# Patient Record
Sex: Female | Born: 1980
Health system: Southern US, Community
[De-identification: ages and names within clinical notes are randomized; demographics above are authoritative.]

## PROBLEM LIST (undated history)

## (undated) DIAGNOSIS — L509 Urticaria, unspecified: Secondary | ICD-10-CM

## (undated) DIAGNOSIS — H5500 Unspecified nystagmus: Secondary | ICD-10-CM

## (undated) DIAGNOSIS — G629 Polyneuropathy, unspecified: Secondary | ICD-10-CM

## (undated) DIAGNOSIS — D649 Anemia, unspecified: Secondary | ICD-10-CM

## (undated) DIAGNOSIS — F32A Depression, unspecified: Secondary | ICD-10-CM

## (undated) DIAGNOSIS — K219 Gastro-esophageal reflux disease without esophagitis: Secondary | ICD-10-CM

## (undated) DIAGNOSIS — F419 Anxiety disorder, unspecified: Secondary | ICD-10-CM

## (undated) DIAGNOSIS — R87619 Unspecified abnormal cytological findings in specimens from cervix uteri: Secondary | ICD-10-CM

## (undated) DIAGNOSIS — N946 Dysmenorrhea, unspecified: Secondary | ICD-10-CM

## (undated) DIAGNOSIS — M419 Scoliosis, unspecified: Secondary | ICD-10-CM

## (undated) DIAGNOSIS — F329 Major depressive disorder, single episode, unspecified: Secondary | ICD-10-CM

## (undated) DIAGNOSIS — H5501 Congenital nystagmus: Secondary | ICD-10-CM

## (undated) DIAGNOSIS — C801 Malignant (primary) neoplasm, unspecified: Secondary | ICD-10-CM

## (undated) DIAGNOSIS — L309 Dermatitis, unspecified: Secondary | ICD-10-CM

## (undated) HISTORY — DX: Dermatitis, unspecified: L30.9

## (undated) HISTORY — DX: Scoliosis, unspecified: M41.9

## (undated) HISTORY — DX: Anxiety disorder, unspecified: F41.9

## (undated) HISTORY — DX: Unspecified abnormal cytological findings in specimens from cervix uteri: R87.619

## (undated) HISTORY — DX: Anemia, unspecified: D64.9

## (undated) HISTORY — DX: Polyneuropathy, unspecified: G62.9

## (undated) HISTORY — DX: Depression, unspecified: F32.A

## (undated) HISTORY — DX: Urticaria, unspecified: L50.9

## (undated) HISTORY — DX: Gastro-esophageal reflux disease without esophagitis: K21.9

## (undated) HISTORY — DX: Congenital nystagmus: H55.01

## (undated) HISTORY — DX: Dysmenorrhea, unspecified: N94.6

## (undated) HISTORY — DX: Major depressive disorder, single episode, unspecified: F32.9

## (undated) HISTORY — PX: WISDOM TOOTH EXTRACTION: SHX21

## (undated) HISTORY — DX: Unspecified nystagmus: H55.00

---

## 2008-01-20 LAB — HM PAP SMEAR

## 2013-12-18 ENCOUNTER — Ambulatory Visit (INDEPENDENT_AMBULATORY_CARE_PROVIDER_SITE_OTHER): Payer: BC Managed Care – PPO | Admitting: Physician Assistant

## 2013-12-18 ENCOUNTER — Encounter: Payer: Self-pay | Admitting: Physician Assistant

## 2013-12-18 VITALS — BP 100/70 | HR 98 | Temp 98.2°F | Resp 16 | Ht 67.0 in | Wt 191.0 lb

## 2013-12-18 DIAGNOSIS — J029 Acute pharyngitis, unspecified: Secondary | ICD-10-CM

## 2013-12-18 DIAGNOSIS — J019 Acute sinusitis, unspecified: Secondary | ICD-10-CM

## 2013-12-18 LAB — POCT RAPID STREP A (OFFICE): Rapid Strep A Screen: NEGATIVE

## 2013-12-18 MED ORDER — CEPHALEXIN 500 MG PO CAPS: 500.0000 mg | ORAL_CAPSULE | Freq: Two times a day (BID) | ORAL | Status: DC

## 2013-12-18 MED ORDER — DOXYCYCLINE HYCLATE 100 MG PO TABS: 100.0000 mg | ORAL_TABLET | Freq: Two times a day (BID) | ORAL | Status: DC

## 2013-12-18 NOTE — Progress Notes (Signed)
Subjective:    Patient ID: Tara Velazquez, female    DOB: 11-Jul-1981, 33 y.o.   MRN: [id number]  Sore Throat  This is a new problem. The current episode started 1 to 4 weeks ago. The problem has been gradually worsening. Neither side of throat is experiencing more pain than the other. There has been no fever. The pain is mild. Associated symptoms include congestion, coughing, headaches, a hoarse voice and swollen glands. Pertinent negatives include no abdominal pain, diarrhea, drooling, ear discharge, ear pain, plugged ear sensation, neck pain, shortness of breath, stridor, trouble swallowing or vomiting. She has had exposure to strep (son positive for strep last week.). She has had no exposure to mono. Treatments tried: dayquil, excedrin. The treatment provided no relief.      Review of Systems  HENT: Positive for congestion and hoarse voice. Negative for drooling, ear discharge, ear pain and trouble swallowing.   Respiratory: Positive for cough. Negative for shortness of breath and stridor.   Gastrointestinal: Negative for vomiting, abdominal pain and diarrhea.  Musculoskeletal: Negative for neck pain.  Neurological: Positive for headaches.  All other systems reviewed and are negative.  Past Medical History  Diagnosis Date  . Depression    Past Surgical History  Procedure Laterality Date  . Cesarean section      2009, 2006, and 2004    reports that she has been smoking.  She does not have any smokeless tobacco history on file. She reports that she drinks alcohol. Her drug history is not on file. family history includes Colon cancer in an other family member; Diabetes in an other family member. Allergies  Allergen Reactions  . Amoxicillin   . Coconut Flavor        Objective:   Physical Exam  Nursing note and vitals reviewed. Constitutional: She is oriented to person, place, and time. She appears well-developed and well-nourished. No distress.  HENT:  Head: Normocephalic and  atraumatic.  Right Ear: External ear normal.  Left Ear: External ear normal.  Nose: Nose normal.  Mouth/Throat: No oropharyngeal exudate.  Oropharynx appeared slightly erythematous, no exudate.  Bilateral tympanic membranes appear normal.  Left maxillary sinus exquisitely tender to palpation. Right maxillary sinus nontender to palpation. Bilateral frontal sinuses nontender to palpation.  Eyes: Conjunctivae and EOM are normal. Pupils are equal, round, and reactive to light.  Neck: Normal range of motion. Neck supple. No tracheal deviation present. No thyromegaly present.  Cardiovascular: Normal rate, regular rhythm, normal heart sounds and intact distal pulses.  Exam reveals no gallop and no friction rub.   No murmur heard. Pulmonary/Chest: Effort normal and breath sounds normal. No respiratory distress. She has no wheezes. She has no rales. She exhibits no tenderness.  Abdominal: Soft. Bowel sounds are normal. There is no tenderness.  Musculoskeletal: Normal range of motion. She exhibits no edema.  Lymphadenopathy:    She has no cervical adenopathy.  Neurological: She is alert and oriented to person, place, and time. She has normal reflexes. No cranial nerve deficit. Coordination normal.  Skin: Skin is warm and dry. No rash noted. She is not diaphoretic. No erythema. No pallor.  Psychiatric: She has a normal mood and affect. Her behavior is normal. Judgment and thought content normal.   Filed Vitals:   12/18/13 0803  BP: 100/70  Pulse: 98  Temp: 98.2 F (36.8 C)  Resp: 16   No results found for this basename: WBC, HGB, HCT, PLT, GLUCOSE, CHOL, TRIG, HDL, LDLDIRECT, LDLCALC, ALT, AST, NA,  K, CL, CREATININE, BUN, CO2, TSH, PSA, INR, GLUF, HGBA1C, MICROALBUR      Assessment & Plan:  Merril was seen today for sore throat.  Diagnoses and associated orders for this visit:  Acute sinusitis - Discontinue: doxycycline (VIBRA-TABS) 100 MG tablet; Take 1 tablet (100 mg total) by mouth 2  (two) times daily. - cephALEXin (KEFLEX) 500 MG capsule; Take 1 capsule (500 mg total) by mouth 2 (two) times daily.  Sore throat - POC Rapid Strep A - Throat culture ([facility]) - cephALEXin (KEFLEX) 500 MG capsule; Take 1 capsule (500 mg total) by mouth 2 (two) times daily.    Patient Instructions  Cephalexin 1 tab by mouth twice a day x10 days for sinusitis.  We will call with the results of throat culture.  Followup to clinic if symptoms worsen or do not improve despite treatment.

## 2013-12-18 NOTE — Progress Notes (Signed)
Pre visit review using our clinic review tool, if applicable. No additional management support is needed unless otherwise documented below in the visit note. 

## 2013-12-18 NOTE — Patient Instructions (Addendum)
Cephalexin 1 tab by mouth twice a day x10 days for sinusitis.  We will call with the results of throat culture.  Followup to clinic if symptoms worsen or do not improve despite treatment.  Sinusitis Sinusitis is redness, soreness, and puffiness (inflammation) of the air pockets in the bones of your face (sinuses). The redness, soreness, and puffiness can cause air and mucus to get trapped in your sinuses. This can allow germs to grow and cause an infection.  HOME CARE   Drink enough fluids to keep your pee (urine) clear or pale yellow.  Use a humidifier in your home.  Run a hot shower to create steam in the bathroom. Sit in the bathroom with the door closed. Breathe in the steam 3 4 times a day.  Put a warm, moist washcloth on your face 3 4 times a day, or as told by your doctor.  Use salt water sprays (saline sprays) to wet the thick fluid in your nose. This can help the sinuses drain.  Only take medicine as told by your doctor. GET HELP RIGHT AWAY IF:   Your pain gets worse.  You have very bad headaches.  You are sick to your stomach (nauseous).  You throw up (vomit).  You are very sleepy (drowsy) all the time.  Your face is puffy (swollen).  Your vision changes.  You have a stiff neck.  You have trouble breathing. MAKE SURE YOU:   Understand these instructions.  Will watch your condition.  Will get help right away if you are not doing well or get worse. Document Released: 01/24/2008 Document Revised: 05/01/2012 Document Reviewed: 03/12/2012 [facility] Patient Information 2014 [employer].

## 2013-12-20 LAB — CULTURE, GROUP A STREP: Organism ID, Bacteria: NORMAL

## 2014-01-21 ENCOUNTER — Encounter: Payer: Self-pay | Admitting: Family Medicine

## 2014-01-21 ENCOUNTER — Ambulatory Visit (INDEPENDENT_AMBULATORY_CARE_PROVIDER_SITE_OTHER): Payer: BC Managed Care – PPO | Admitting: Family Medicine

## 2014-01-21 VITALS — BP 100/72 | HR 88 | Temp 98.1°F | Ht 67.0 in | Wt 189.5 lb

## 2014-01-21 DIAGNOSIS — J329 Chronic sinusitis, unspecified: Secondary | ICD-10-CM

## 2014-01-21 MED ORDER — DOXYCYCLINE HYCLATE 100 MG PO TABS: 100.0000 mg | ORAL_TABLET | Freq: Two times a day (BID) | ORAL | Status: DC

## 2014-01-21 NOTE — Patient Instructions (Addendum)
-  As we discussed, we have prescribed a new medication (DOXYCYCLINE) for you at this appointment. We discussed the common and serious potential adverse effects of this medication and you can review these and more with the pharmacist when you pick up your medication.  Please follow the instructions for use carefully and notify us immediately if you have any problems taking this medication.  -if sinus issues recur or persist call the Ear, Nose and throat doctor for an appointment

## 2014-01-21 NOTE — Progress Notes (Signed)
Pre visit review using our clinic review tool, if applicable. No additional management support is needed unless otherwise documented below in the visit note. 

## 2014-01-21 NOTE — Progress Notes (Signed)
No chief complaint on file.   HPI:  -started: 1 month ago and she reports treated for sinusitis and improved but then returned after antibiotic finished -symptoms:nasal congestion, sore throat, cough - sinus pressure and pain for 1 week -denies:fever, SOB, NVD, tooth pain -has tried: OTC cough and cold medications -sick contacts/travel/risks: denies flu exposure, tick exposure or or Ebola risks -son with similar symptoms -she reports she does't know if she even has allergy to amoxicillin - ? Rash with it once  ROS: See pertinent positives and negatives per HPI.  Past Medical History  Diagnosis Date  . Depression     Past Surgical History  Procedure Laterality Date  . Cesarean section      2009, 2006, and 2004    Family History  Problem Relation Age of Onset  . Colon cancer    . Diabetes      History   Social History  . Marital Status: Single    Spouse Name: N/A    Number of Children: N/A  . Years of Education: N/A   Social History Main Topics  . Smoking status: Light Tobacco Smoker  . Smokeless tobacco: Not on file  . Alcohol Use: Yes     Comment: once every couple of weeks   . Drug Use: Not on file  . Sexual Activity: Not on file   Other Topics Concern  . Not on file   Social History Narrative  . No narrative on file    Current outpatient prescriptions:cephALEXin (KEFLEX) 500 MG capsule, Take 1 capsule (500 mg total) by mouth 2 (two) times daily., Disp: 20 capsule, Rfl: 0;  IRON PO, Take by mouth., Disp: , Rfl:   EXAM:  There were no vitals filed for this visit.  There is no weight on file to calculate BMI.  GENERAL: vitals reviewed and listed above, alert, oriented, appears well hydrated and in no acute distress  HEENT: atraumatic, conjunttiva clear, no obvious abnormalities on inspection of external nose and ears, normal appearance of ear canals and TMs, clear nasal congestion, mild post oropharyngeal erythema with PND, no tonsillar edema or  exudate, no sinus TTP  NECK: no obvious masses on inspection  LUNGS: clear to auscultation bilaterally, no wheezes, rales or rhonchi, good air movement  CV: HRRR, no peripheral edema  MS: moves all extremities without noticeable abnormality  PSYCH: pleasant and cooperative, no obvious depression or anxiety  ASSESSMENT AND PLAN:  Discussed the following assessment and plan:  No diagnosis found.  -given HPI and exam findings today, a serious infection or illness is unlikely. We discussed potential etiologies, with VURI being most likely, and advised supportive care and monitoring. We discussed treatment side effects, likely course, antibiotic misuse, transmission, and signs of developing a serious illness. -of course, we advised to return or notify a doctor immediately if symptoms worsen or persist or new concerns arise.    There are no Patient Instructions on file for this visit.   Lucretia Kern

## 2014-05-07 ENCOUNTER — Telehealth: Payer: Self-pay | Admitting: Physician Assistant

## 2014-05-07 NOTE — Telephone Encounter (Signed)
Sure. Schedule new pt/transfer visit with me.

## 2014-05-07 NOTE — Telephone Encounter (Signed)
Pt would like to know if she can become a pt. She was with [facility].

## 2014-05-13 NOTE — Telephone Encounter (Signed)
Has been schedule

## 2014-06-23 DIAGNOSIS — R7989 Other specified abnormal findings of blood chemistry: Secondary | ICD-10-CM | POA: Insufficient documentation

## 2014-06-23 DIAGNOSIS — K219 Gastro-esophageal reflux disease without esophagitis: Secondary | ICD-10-CM | POA: Insufficient documentation

## 2014-06-23 DIAGNOSIS — N6019 Diffuse cystic mastopathy of unspecified breast: Secondary | ICD-10-CM | POA: Insufficient documentation

## 2014-06-23 DIAGNOSIS — R945 Abnormal results of liver function studies: Secondary | ICD-10-CM

## 2014-06-23 HISTORY — DX: Gastro-esophageal reflux disease without esophagitis: K21.9

## 2014-06-25 ENCOUNTER — Ambulatory Visit (INDEPENDENT_AMBULATORY_CARE_PROVIDER_SITE_OTHER): Payer: BC Managed Care – PPO | Admitting: Family Medicine

## 2014-06-25 ENCOUNTER — Encounter: Payer: Self-pay | Admitting: Family Medicine

## 2014-06-25 VITALS — BP 110/82 | HR 80 | Temp 97.9°F | Ht 67.0 in | Wt 186.8 lb

## 2014-06-25 DIAGNOSIS — Z23 Encounter for immunization: Secondary | ICD-10-CM

## 2014-06-25 DIAGNOSIS — Z720 Tobacco use: Secondary | ICD-10-CM

## 2014-06-25 DIAGNOSIS — R202 Paresthesia of skin: Secondary | ICD-10-CM

## 2014-06-25 DIAGNOSIS — Z7689 Persons encountering health services in other specified circumstances: Secondary | ICD-10-CM

## 2014-06-25 DIAGNOSIS — N920 Excessive and frequent menstruation with regular cycle: Secondary | ICD-10-CM

## 2014-06-25 DIAGNOSIS — R42 Dizziness and giddiness: Secondary | ICD-10-CM

## 2014-06-25 DIAGNOSIS — D509 Iron deficiency anemia, unspecified: Secondary | ICD-10-CM

## 2014-06-25 DIAGNOSIS — Z7189 Other specified counseling: Secondary | ICD-10-CM

## 2014-06-25 LAB — CBC WITH DIFFERENTIAL/PLATELET
BASOS ABS: 0 10*3/uL (ref 0.0–0.1)
Basophils Relative: 0.5 % (ref 0.0–3.0)
EOS PCT: 1.4 % (ref 0.0–5.0)
Eosinophils Absolute: 0.1 10*3/uL (ref 0.0–0.7)
HEMATOCRIT: 42 % (ref 36.0–46.0)
HEMOGLOBIN: 14 g/dL (ref 12.0–15.0)
LYMPHS ABS: 1.8 10*3/uL (ref 0.7–4.0)
LYMPHS PCT: 27.6 % (ref 12.0–46.0)
MCHC: 33.3 g/dL (ref 30.0–36.0)
MCV: 86.7 fl (ref 78.0–100.0)
Monocytes Absolute: 0.4 10*3/uL (ref 0.1–1.0)
Monocytes Relative: 6.5 % (ref 3.0–12.0)
Neutro Abs: 4.1 10*3/uL (ref 1.4–7.7)
Neutrophils Relative %: 64 % (ref 43.0–77.0)
Platelets: 236 10*3/uL (ref 150.0–400.0)
RBC: 4.84 Mil/uL (ref 3.87–5.11)
RDW: 13.4 % (ref 11.5–15.5)
WBC: 6.4 10*3/uL (ref 4.0–10.5)

## 2014-06-25 LAB — COMPREHENSIVE METABOLIC PANEL
ALT: 11 U/L (ref 0–35)
AST: 15 U/L (ref 0–37)
Albumin: 3.9 g/dL (ref 3.5–5.2)
Alkaline Phosphatase: 47 U/L (ref 39–117)
BILIRUBIN TOTAL: 1 mg/dL (ref 0.2–1.2)
BUN: 13 mg/dL (ref 6–23)
CO2: 22 mEq/L (ref 19–32)
CREATININE: 0.8 mg/dL (ref 0.4–1.2)
Calcium: 9.2 mg/dL (ref 8.4–10.5)
Chloride: 108 mEq/L (ref 96–112)
GFR: 84.75 mL/min (ref >60.00)
GLUCOSE: 80 mg/dL (ref 70–99)
Potassium: 4.5 mEq/L (ref 3.5–5.1)
SODIUM: 139 meq/L (ref 135–145)
Total Protein: 7.6 g/dL (ref 6.0–8.3)

## 2014-06-25 LAB — LIPID PANEL
CHOLESTEROL: 166 mg/dL (ref 0–200)
HDL: 43.1 mg/dL (ref >39.00)
LDL Cholesterol: 112 mg/dL — ABNORMAL HIGH (ref 0–99)
NONHDL: 122.9
Total CHOL/HDL Ratio: 4
Triglycerides: 57 mg/dL (ref 0.0–149.0)
VLDL: 11.4 mg/dL (ref 0.0–40.0)

## 2014-06-25 LAB — VITAMIN B12: Vitamin B-12: 563 pg/mL (ref 211–911)

## 2014-06-25 LAB — HEMOGLOBIN A1C: Hgb A1c MFr Bld: 4.8 % (ref 4.6–6.5)

## 2014-06-25 LAB — TSH: TSH: 1.2 u[IU]/mL (ref 0.35–4.50)

## 2014-06-25 MED ORDER — BUPROPION HCL ER (SR) 150 MG PO TB12: 150.0000 mg | ORAL_TABLET | Freq: Every day | ORAL | Status: DC

## 2014-06-25 NOTE — Addendum Note (Signed)
Addended by: Agnes Lawrence on: 06/25/2014 12:38 PM   Modules accepted: Orders

## 2014-06-25 NOTE — Progress Notes (Signed)
HPI:  Tara Velazquez is here to establish care.  Last PCP and physical:  Has the following chronic problems that require follow up and concerns today:  Anemia secondary to Heavy Menstrual Bleeding: -periods have been very heavy since 2009, heavy her whole life -regular, monthly, last 7 days, 4 days of heavy bleeding with super tampons/overnight pad at least hourly - can't leave the home -dx anemia 2004, Hgb 7.4 2013, sent to hospital, had work up, told should have hysterectomy, never saw gyn as advised, taking iron therapy -she wants to see a gynecologist now  Hx of major depression: -has been on several medications in the past including effexor, wellbutrin -has been several years since she has been on medication -denies: SI, thoughts of self harm  Tobacco Abuse: -1 ppd for 10 years -denies: CP, SOB, wheezing, hemoptysis, chronic cough  Vertigo/paresthesias in hands and feet intermittently: -for at least one year -several times per week, lasts for a few minutes -denies: weakness, vision changes, hearing loss, gait issues  ROS negative for unless reported above: fevers, unintentional weight loss, hearing or vision loss, chest pain, palpitations, struggling to breath, hemoptysis, melena, hematochezia, hematuria, falls, loc, si, thoughts of self harm  Past Medical History  Diagnosis Date  . Depression   . Nystagmus     her whole life, sees [facility]    Past Surgical History  Procedure Laterality Date  . Cesarean section      2009, 2006, and 2004    Family History  Problem Relation Age of Onset  . Colon cancer Father   . Cancer Brother     had hysterectomy at age 4  . Diabetes Maternal Grandmother   . Diabetes Paternal Grandfather     History   Social History  . Marital Status: Single    Spouse Name: N/A    Number of Children: N/A  . Years of Education: N/A   Social History Main Topics  . Smoking status: Current Every Day Smoker -- 1.00 packs/day   Types: Cigarettes  . Smokeless tobacco: None  . Alcohol Use: Yes     Comment: once every couple of weeks   . Drug Use: No  . Sexual Activity: None   Other Topics Concern  . None   Social History Narrative   Work or School: homemaker      Home Situation: lives with husband and 3 children      Spiritual Beliefs: none      Lifestyle: walks/jogs 2 times per week/ diet is poor          Current outpatient prescriptions: buPROPion (WELLBUTRIN SR) 150 MG 12 hr tablet, Take 1 tablet (150 mg total) by mouth daily., Disp: 90 tablet, Rfl: 0;  IRON PO, Take by mouth., Disp: , Rfl:   EXAM:  Filed Vitals:   06/25/14 1101  BP: 110/82  Pulse: 80  Temp: 97.9 F (36.6 C)    Body mass index is 29.25 kg/(m^2).  GENERAL: vitals reviewed and listed above, alert, oriented, appears well hydrated and in no acute distress  HEENT: atraumatic, conjunttiva clear, PERRLA, nystagmus, no obvious abnormalities on inspection of external nose and ears  NECK: no obvious masses on inspection  LUNGS: clear to auscultation bilaterally, no wheezes, rales or rhonchi, good air movement  CV: HRRR, no peripheral edema  MS: moves all extremities without noticeable abnormality  PSYCH: pleasant and cooperative, no obvious depression or anxiety  NEURO: CNII-XII grossly intact, gait normal, strength and sensation to light  touch and vibratory intact throughout, finger to nose normal, nystagmus  ASSESSMENT AND PLAN:  Discussed the following assessment and plan:  Menorrhagia with regular cycle - Plan: Ambulatory referral to Gynecology, CBC with Differential, CMP  Anemia, iron deficiency - Plan: Ambulatory referral to Gynecology, TSH  Encounter to establish care - Plan: Hemoglobin A1c, Lipid panel, CMP  Paresthesia - Plan: Vitamin B12 Vertigo - Plan: CMP, TSH -normal neuro exam, suspect anxiety, consider neuro referrla  Tobacco abuse - Plan: buPROPion (WELLBUTRIN SR) 150 MG 12 hr tablet -advised to  quit, counseled 5 minutes, opted to try wellbutrin again, follow up 6 weeks  -We reviewed the PMH, PSH, FH, SH, Meds and Allergies. -We provided refills for any medications we will prescribe as needed. -We addressed current concerns per orders and patient instructions. -We have asked for records for pertinent exams, studies, vaccines and notes from previous providers. -We have advised patient to follow up per instructions below.   -Patient advised to return or notify a doctor immediately if symptoms worsen or persist or new concerns arise.  Patient Instructions  BEFORE YOU LEAVE: -Tdap today -labs -follow up in 6 weeks  -We placed a referral for you as discussed to the gynecologist. It usually takes about 1-2 weeks to process and schedule this referral. If you have not heard from [place] regarding this appointment in 2 weeks please contact our office.  SMOKING: -start the wellbutrin 150 once per day in the morning tomorrow -quit smoking in 1 week -call the quitline -hang in there - you can do this!       Colin Benton R.

## 2014-06-25 NOTE — Progress Notes (Signed)
Pre visit review using our clinic review tool, if applicable. No additional management support is needed unless otherwise documented below in the visit note. 

## 2014-06-25 NOTE — Patient Instructions (Addendum)
BEFORE YOU LEAVE: -Tdap today -labs -follow up in 6 weeks  -We placed a referral for you as discussed to the gynecologist. It usually takes about 1-2 weeks to process and schedule this referral. If you have not heard from [place] regarding this appointment in 2 weeks please contact our office.  SMOKING: -start the wellbutrin 150 once per day in the morning tomorrow -quit smoking in 1 week -call the quitline -hang in there - you can do this!

## 2014-06-26 ENCOUNTER — Telehealth: Payer: Self-pay | Admitting: Family Medicine

## 2014-06-26 NOTE — Telephone Encounter (Signed)
emmi mailed  °

## 2014-07-31 ENCOUNTER — Telehealth: Payer: Self-pay

## 2014-07-31 ENCOUNTER — Encounter: Payer: Self-pay | Admitting: Obstetrics and Gynecology

## 2014-07-31 ENCOUNTER — Ambulatory Visit (INDEPENDENT_AMBULATORY_CARE_PROVIDER_SITE_OTHER): Payer: BC Managed Care – PPO | Admitting: Obstetrics and Gynecology

## 2014-07-31 VITALS — BP 110/70 | HR 76 | Resp 20 | Ht 66.0 in | Wt 188.0 lb

## 2014-07-31 DIAGNOSIS — N920 Excessive and frequent menstruation with regular cycle: Secondary | ICD-10-CM

## 2014-07-31 LAB — HEMOGLOBIN, FINGERSTICK: HEMOGLOBIN, FINGERSTICK: 13.3 g/dL (ref 12.0–16.0)

## 2014-07-31 NOTE — Progress Notes (Signed)
Patient ID: Tara Velazquez, female   DOB: March 03, 1981, 33 y.o.   MRN: [id number] GYNECOLOGY VISIT  PCP:  Colin Benton, DO  Referring provider:  Colin Benton, DO  HPI: 33 y.o.   Married  Caucasian  female   [phone] with Patient's last menstrual period was 07/28/2014 (exact date).   here for heavy menstrual cycles with moderate pain.  Cycles last 7 days and has a history of anemia.  Has been taking iron daily for 3 years.    Always had heavy cycles.  Menses every month.  Change pap and tampon every hour.  Some ibuprofen as needed for cramping. Takes iron every day.   States she gets tearful and crys when she has doctor visits.  Had difficulty getting tetanus injection due to anxiety.   Smoker.   43, 45 and 63 year old children.   Hgb:    14.0 06-25-14 with PCP, Hgb 13.3. today    GYNECOLOGIC HISTORY: Patient's last menstrual period was 07/28/2014 (exact date). Sexually active:  yes Partner preference: female Contraception:  Tubal  Menopausal hormone therapy: n/a DES exposure:  unsure Blood transfusions: no   Sexually transmitted diseases: no GYN procedures and prior surgeries:  Tubal ligation, C-Section x3 Last mammogram: n/a                Last pap and high risk HPV testing:   2008 wnl History of abnormal pap smear:  no   OB History    Gravida Para Term Preterm AB TAB SAB Ectopic Multiple Living   3 3 3       3        LIFESTYLE: Exercise:   Walking and "playing with kids"          Tobacco:    no Alcohol:      1-2 drinks per week Drug use:    no  There are no active problems to display for this patient.   Past Medical History  Diagnosis Date  . Depression   . Nystagmus     her whole life, sees [facility]  . Anemia     d/t heavy menstrual cycles  . Anxiety   . Dysmenorrhea     Past Surgical History  Procedure Laterality Date  . Cesarean section      2009, 2006, and 2004  . Wisdom tooth extraction      Current Outpatient Prescriptions  Medication Sig  Dispense Refill  . buPROPion (WELLBUTRIN SR) 150 MG 12 hr tablet Take 1 tablet (150 mg total) by mouth daily. 90 tablet 0  . IRON PO Take by mouth.     No current facility-administered medications for this visit.     ALLERGIES: Amoxicillin and Coconut flavor  Family History  Problem Relation Age of Onset  . Colon cancer Father   . Cancer Brother     had hysterectomy at age 58  . Diabetes Maternal Grandmother   . Diabetes Paternal Grandfather   . Cancer Sister 81    Dec from MVA--but had hysterectomy age 52 ?CA (pt. unsure of type)  . Hyperlipidemia Mother   . Stroke Maternal Grandfather     History   Social History  . Marital Status: Married    Spouse Name: N/A    Number of Children: N/A  . Years of Education: N/A   Occupational History  . Not on file.   Social History Main Topics  . Smoking status: Current Every Day Smoker -- 1.00 packs/day    Types:  Cigarettes  . Smokeless tobacco: Not on file  . Alcohol Use: 1.2 oz/week    2 Not specified per week     Comment: once every couple of weeks   . Drug Use: No  . Sexual Activity:    Partners: Male    Birth Control/ Protection: Surgical     Comment: Tubal   Other Topics Concern  . Not on file   Social History Narrative   Work or School: homemaker      Home Situation: lives with husband and 3 children      Spiritual Beliefs: none      Lifestyle: walks/jogs 2 times per week/ diet is poor          ROS:  Pertinent items are noted in HPI.  PHYSICAL EXAMINATION:    BP 110/70 mmHg  Pulse 76  Resp 20  Ht 5\' 6"  (1.676 m)  Wt 188 lb (85.276 kg)  BMI 30.36 kg/m2  LMP 07/28/2014 (Exact Date)   Wt Readings from Last 3 Encounters:  07/31/14 188 lb (85.276 kg)  06/25/14 186 lb 12.8 oz (84.732 kg)  01/21/14 189 lb 8 oz (85.957 kg)     Ht Readings from Last 3 Encounters:  07/31/14 5\' 6"  (1.676 m)  06/25/14 5\' 7"  (1.702 m)  01/21/14 5\' 7"  (1.702 m)    General appearance: alert, tearful, answers questions  well.  Declines physical examination today.   ASSESSMENT  Menorrhagia. History of anemia.  Status post BTL. Status post C/S x 3.   PLAN  Return for pelvic ultrasound and physical exam.  May need EMB.  Started to discuss this and patient started crying and asking to stop talking about this possibility.  Written materials to patient about menorrhagia.  30 minutes face to face time of which over 50% was spent in counseling.   An After Visit Summary was printed and given to the patient.

## 2014-07-31 NOTE — Telephone Encounter (Signed)
Called patient on [phone] to give results of HGB 13.3, no answer and voicemail did not identify patient.  LMOVM for patient to call me.

## 2014-07-31 NOTE — Patient Instructions (Signed)

## 2014-07-31 NOTE — Telephone Encounter (Signed)
Spoke with patient and notified Hgb 13.3 which is normal.  Someone from our office will be calling her to schedule her pelvic ultrasound.

## 2014-08-06 ENCOUNTER — Ambulatory Visit: Payer: BC Managed Care – PPO | Admitting: Family Medicine

## 2014-08-20 ENCOUNTER — Encounter: Payer: Self-pay | Admitting: Family Medicine

## 2014-08-20 ENCOUNTER — Ambulatory Visit (INDEPENDENT_AMBULATORY_CARE_PROVIDER_SITE_OTHER): Payer: BC Managed Care – PPO | Admitting: Family Medicine

## 2014-08-20 ENCOUNTER — Ambulatory Visit (INDEPENDENT_AMBULATORY_CARE_PROVIDER_SITE_OTHER): Payer: BC Managed Care – PPO | Admitting: Obstetrics and Gynecology

## 2014-08-20 ENCOUNTER — Ambulatory Visit (INDEPENDENT_AMBULATORY_CARE_PROVIDER_SITE_OTHER): Payer: BC Managed Care – PPO

## 2014-08-20 ENCOUNTER — Encounter: Payer: Self-pay | Admitting: Obstetrics and Gynecology

## 2014-08-20 VITALS — BP 110/80 | HR 99 | Temp 98.3°F | Ht 66.0 in | Wt 191.5 lb

## 2014-08-20 VITALS — BP 104/70 | HR 60 | Ht 66.0 in | Wt 191.0 lb

## 2014-08-20 DIAGNOSIS — N946 Dysmenorrhea, unspecified: Secondary | ICD-10-CM

## 2014-08-20 DIAGNOSIS — F4323 Adjustment disorder with mixed anxiety and depressed mood: Secondary | ICD-10-CM

## 2014-08-20 DIAGNOSIS — F17200 Nicotine dependence, unspecified, uncomplicated: Secondary | ICD-10-CM

## 2014-08-20 DIAGNOSIS — N920 Excessive and frequent menstruation with regular cycle: Secondary | ICD-10-CM

## 2014-08-20 DIAGNOSIS — F329 Major depressive disorder, single episode, unspecified: Secondary | ICD-10-CM

## 2014-08-20 DIAGNOSIS — Z720 Tobacco use: Secondary | ICD-10-CM

## 2014-08-20 DIAGNOSIS — F32A Depression, unspecified: Secondary | ICD-10-CM

## 2014-08-20 MED ORDER — IBUPROFEN 600 MG PO TABS: 600.0000 mg | ORAL_TABLET | Freq: Four times a day (QID) | ORAL | Status: DC | PRN

## 2014-08-20 MED ORDER — BUPROPION HCL ER (SR) 150 MG PO TB12: 150.0000 mg | ORAL_TABLET | Freq: Two times a day (BID) | ORAL | Status: DC

## 2014-08-20 NOTE — Progress Notes (Signed)
Pre visit review using our clinic review tool, if applicable. No additional management support is needed unless otherwise documented below in the visit note. 

## 2014-08-20 NOTE — Progress Notes (Signed)
  Subjective   Patient is here today for pelvic ultrasound for heavy regular cycles with moderate pain. Took ibuprofen 600 mg every 6 hours with last cycle and pain and bleeding stopped.  "I like to keep it simple." "I don't want surgery. "  Status post BTL.   Wants to try stopping iron.  Will see PCP in one month for a recheck and can check Hgb then.   Sister had diagnosis of germ cell tumor at age 34.   Objective   Images and report reviewed with patient.   Uterus without masses.  EMX 19.7 mm Ovaries WNL - 22 mm collapse left CL cyst.  Small free fluid in the pelvis:  26 x 17 mm.       Assessment  Menorrhagia and dysmenorrhea improved with NSAIDs. History of anemia.  Status post BTL. Status post C/S x 3.   Plan  Ibuprofen 600 gm po q 6 hours prn dysmenorrhea and menorrhagia with cycles.  Cautioned about gastritis. OK to stop iron.  Recheck Hgb with PCP in one month.  Follow up for annual exam in 2 months.    15 minutes face to face time of which over 50% was spent in counseling.   After visit summary to patient.

## 2014-08-20 NOTE — Progress Notes (Signed)
HPI:  Follow up:  Tobacco abuse: -rx for wellbutrin 06/2014 -reports: has been trying -  -denies: hemoptysis  Anxiety/Mild depression: -sufferes from depressed mood, frequent crying, hx depression - on effexor in the past - did not tolerate -feel wellbutrin has helped with depression and wants to continue/increase -reports does not want to take other medications or see psych -cries frequently at doctor visits - reported very heavy menstrual visits last visit - labs all normal; referred to gyn -per gyn notes when seen and they advised [place], etc she cried an told them to stop talking -reports: seeing gyn today -denies: SI, depression -has been long time since she has done counseling or seen psych  ROS: See pertinent positives and negatives per HPI.  Past Medical History  Diagnosis Date  . Depression   . Nystagmus     her whole life, sees [place]  . Anemia     d/t heavy menstrual cycles  . Anxiety   . Dysmenorrhea     Past Surgical History  Procedure Laterality Date  . Cesarean section      2009, 2006, and 2004  . Wisdom tooth extraction      Family History  Problem Relation Age of Onset  . Colon cancer Father   . Cancer Brother     had hysterectomy at age 68  . Diabetes Maternal Grandmother   . Diabetes Paternal Grandfather   . Cancer Sister 109    Dec from MVA--but had hysterectomy age 22 ?CA (pt. unsure of type)  . Hyperlipidemia Mother   . Stroke Maternal Grandfather     History   Social History  . Marital Status: Married    Spouse Name: N/A    Number of Children: N/A  . Years of Education: N/A   Social History Main Topics  . Smoking status: Current Every Day Smoker -- 1.00 packs/day    Types: Cigarettes  . Smokeless tobacco: None  . Alcohol Use: 1.2 oz/week    2 Not specified per week     Comment: once every couple of weeks   . Drug Use: No  . Sexual Activity:    Partners: Male    Birth Control/ Protection: Surgical     Comment: Tubal    Other Topics Concern  . None   Social History Narrative   Work or School: homemaker      Home Situation: lives with husband and 3 children      Spiritual Beliefs: none      Lifestyle: walks/jogs 2 times per week/ diet is poor          Current outpatient prescriptions: buPROPion (WELLBUTRIN SR) 150 MG 12 hr tablet, Take 1 tablet (150 mg total) by mouth 2 (two) times daily., Disp: 60 tablet, Rfl: 3;  IRON PO, Take by mouth., Disp: , Rfl:   EXAM:  Filed Vitals:   08/20/14 0850  BP: 110/80  Pulse: 99  Temp: 98.3 F (36.8 C)    Body mass index is 30.92 kg/(m^2).  GENERAL: vitals reviewed and listed above, alert, oriented, appears well hydrated and in no acute distress  HEENT: atraumatic, conjunttiva clear, no obvious abnormalities on inspection of external nose and ears  NECK: no obvious masses on inspection  LUNGS: clear to auscultation bilaterally, no wheezes, rales or rhonchi, good air movement  CV: HRRR, no peripheral edema  MS: moves all extremities without noticeable abnormality  PSYCH: pleasant and cooperative, no obvious depression or anxiety, tearful at times  ASSESSMENT AND  PLAN:  Discussed the following assessment and plan:  Depression - Plan: buPROPion (WELLBUTRIN SR) 150 MG 12 hr tablet  Adjustment disorder with mixed anxiety and depressed mood - Plan: buPROPion (WELLBUTRIN SR) 150 MG 12 hr tablet  Smoking - Plan: buPROPion (WELLBUTRIN SR) 150 MG 12 hr tablet  -not a fan of meds, nervous about counseling but agreed to try and numbers provided to call -up wellbutrin dose for depression, but explain this does not really treat anxiety -follow up in 1 month - sooner if any worsen -Patient advised to return or notify a doctor immediately if symptoms worsen or persist or new concerns arise.  Patient Instructions  BEFORE YOU LEAVE: -schedule follow up in 1 month  Increase the wellbutrin to 150mg  twice daily  Schedule counseling      Tara Velazquez,  Tara Velazquez R.

## 2014-08-20 NOTE — Patient Instructions (Signed)
BEFORE YOU LEAVE: -schedule follow up in 1 month  Increase the wellbutrin to 150mg  twice daily  Schedule counseling

## 2014-09-25 ENCOUNTER — Ambulatory Visit: Payer: BC Managed Care – PPO | Admitting: Family Medicine

## 2014-09-30 ENCOUNTER — Ambulatory Visit (INDEPENDENT_AMBULATORY_CARE_PROVIDER_SITE_OTHER): Payer: BLUE CROSS/BLUE SHIELD | Admitting: Family Medicine

## 2014-09-30 ENCOUNTER — Other Ambulatory Visit: Payer: Self-pay | Admitting: *Deleted

## 2014-09-30 ENCOUNTER — Encounter: Payer: Self-pay | Admitting: Family Medicine

## 2014-09-30 VITALS — BP 112/70 | HR 88 | Temp 98.1°F | Ht 65.75 in | Wt 177.2 lb

## 2014-09-30 DIAGNOSIS — F329 Major depressive disorder, single episode, unspecified: Secondary | ICD-10-CM

## 2014-09-30 DIAGNOSIS — F32A Depression, unspecified: Secondary | ICD-10-CM

## 2014-09-30 DIAGNOSIS — F419 Anxiety disorder, unspecified: Principal | ICD-10-CM

## 2014-09-30 DIAGNOSIS — F418 Other specified anxiety disorders: Secondary | ICD-10-CM

## 2014-09-30 DIAGNOSIS — D509 Iron deficiency anemia, unspecified: Secondary | ICD-10-CM

## 2014-09-30 DIAGNOSIS — Z720 Tobacco use: Secondary | ICD-10-CM

## 2014-09-30 DIAGNOSIS — D500 Iron deficiency anemia secondary to blood loss (chronic): Secondary | ICD-10-CM

## 2014-09-30 DIAGNOSIS — F17200 Nicotine dependence, unspecified, uncomplicated: Secondary | ICD-10-CM

## 2014-09-30 LAB — CBC WITH DIFFERENTIAL/PLATELET
Basophils Absolute: 0 10*3/uL (ref 0.0–0.1)
Basophils Relative: 0.5 % (ref 0.0–3.0)
EOS ABS: 0.1 10*3/uL (ref 0.0–0.7)
Eosinophils Relative: 1.8 % (ref 0.0–5.0)
HCT: 35.8 % — ABNORMAL LOW (ref 36.0–46.0)
HEMOGLOBIN: 11.9 g/dL — AB (ref 12.0–15.0)
LYMPHS PCT: 21.7 % (ref 12.0–46.0)
Lymphs Abs: 1 10*3/uL (ref 0.7–4.0)
MCHC: 33.2 g/dL (ref 30.0–36.0)
MCV: 84.1 fl (ref 78.0–100.0)
Monocytes Absolute: 0.3 10*3/uL (ref 0.1–1.0)
Monocytes Relative: 6.3 % (ref 3.0–12.0)
Neutro Abs: 3.1 10*3/uL (ref 1.4–7.7)
Neutrophils Relative %: 69.7 % (ref 43.0–77.0)
Platelets: 204 10*3/uL (ref 150.0–400.0)
RBC: 4.26 Mil/uL (ref 3.87–5.11)
RDW: 14 % (ref 11.5–15.5)
WBC: 4.4 10*3/uL (ref 4.0–10.5)

## 2014-09-30 LAB — POCT HEMOGLOBIN: Hemoglobin: 10.9 g/dL — AB (ref 12.2–16.2)

## 2014-09-30 NOTE — Patient Instructions (Signed)
BEFORE YOU LEAVE: -lab -follow up in 3 months  Quit smoking  Continue wellbutrin

## 2014-09-30 NOTE — Progress Notes (Signed)
HPI:  Follow up anxiety and depression:  Depression and anxiety: -increased wellbutrin and advised cbt and 1 mo f/u 07/2014 -suffered from depressed mood, frequent crying, hx depression - on effexor in the past - did not tolerate -feels wellbutrin has helped with depression and has not had any crying spells -cries frequently at doctor visits Sleep disorder: Interest deficit/anhedonia: no, rare Guilt (worthlessness, hopelessness, regret): no Energy deficit: no Concentration deficit: mild Appetite disorder: no Psychomotor retardation or agitation: no Suicidality: no  Tobacco abuse: -rx for wellbutrin 06/2014 -reports: has been trying to quit - has cut back  -denies: hemoptysis  Iron def anemia secondary to Menorrhagia: -now on ibuprofen and bleeding much improved so stopped iron  - told to check hgb today by ob -she refuses CBC as wants finger prick - if off agrees to do cbc   ROS: See pertinent positives and negatives per HPI.  Past Medical History  Diagnosis Date  . Depression   . Nystagmus     her whole life, sees [facility]  . Anemia     d/t heavy menstrual cycles  . Anxiety   . Dysmenorrhea     Past Surgical History  Procedure Laterality Date  . Cesarean section      2009, 2006, and 2004  . Wisdom tooth extraction      Family History  Problem Relation Age of Onset  . Colon cancer Father   . Cancer Brother     had hysterectomy at age 90  . Diabetes Maternal Grandmother   . Diabetes Paternal Grandfather   . Cancer Sister 56    Dec from MVA--but had hysterectomy age 29 ?CA (pt. states mom told her -- sister dx'd with germ induced ovarian ca)  . Hyperlipidemia Mother   . Stroke Maternal Grandfather     History   Social History  . Marital Status: Married    Spouse Name: N/A  . Number of Children: N/A  . Years of Education: N/A   Social History Main Topics  . Smoking status: Current Every Day Smoker -- 1.00 packs/day    Types: Cigarettes  .  Smokeless tobacco: Not on file  . Alcohol Use: 1.2 oz/week    2 Standard drinks or equivalent per week     Comment: once every couple of weeks   . Drug Use: No  . Sexual Activity:    Partners: Male    Birth Control/ Protection: Surgical     Comment: Tubal   Other Topics Concern  . None   Social History Narrative   Work or School: homemaker      Home Situation: lives with husband and 3 children      Spiritual Beliefs: none      Lifestyle: walks/jogs 2 times per week/ diet is poor           Current outpatient prescriptions:  .  buPROPion (WELLBUTRIN SR) 150 MG 12 hr tablet, Take 1 tablet (150 mg total) by mouth 2 (two) times daily., Disp: 60 tablet, Rfl: 3 .  ibuprofen (ADVIL,MOTRIN) 600 MG tablet, Take 1 tablet (600 mg total) by mouth every 6 (six) hours as needed for cramping., Disp: 30 tablet, Rfl: 5  EXAM:  Filed Vitals:   09/30/14 1101  BP: 112/70  Pulse: 88  Temp: 98.1 F (36.7 C)    Body mass index is 28.82 kg/(m^2).  GENERAL: vitals reviewed and listed above, alert, oriented, appears well hydrated and in no acute distress  HEENT: atraumatic, conjunttiva clear,  no obvious abnormalities on inspection of external nose and ears  NECK: no obvious masses on inspection  LUNGS: clear to auscultation bilaterally, no wheezes, rales or rhonchi, good air movement  CV: HRRR, no peripheral edema  MS: moves all extremities without noticeable abnormality  PSYCH: pleasant and cooperative, no obvious depression or anxiety  ASSESSMENT AND PLAN:  Discussed the following assessment and plan:  Anxiety and depression -much improved, continue Wellbutrin -again advised CBT but she does not like to talk to people  Iron deficiency anemia due to chronic blood loss - Plan: POC Hemoglobin -cbc if low  Tobacco use disorder -counseled for 3-10 minutes -advised to quit -she will continue to try, offered help, 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  Patient Instructions  BEFORE YOU LEAVE: -lab -follow up in 3 months  Quit smoking  Continue wellbutrin     Colin Benton R.

## 2014-09-30 NOTE — Addendum Note (Signed)
Addended by: Joyce Gross R on: 09/30/2014 11:36 AM   Modules accepted: Orders

## 2014-09-30 NOTE — Progress Notes (Signed)
Pre visit review using our clinic review tool, if applicable. No additional management support is needed unless otherwise documented below in the visit note. 

## 2014-12-04 ENCOUNTER — Ambulatory Visit: Payer: BC Managed Care – PPO | Admitting: Obstetrics and Gynecology

## 2014-12-28 ENCOUNTER — Telehealth: Payer: Self-pay | Admitting: Nurse Practitioner

## 2014-12-28 NOTE — Telephone Encounter (Signed)
Left message regarding upcoming appointment has been canceled and needs to be rescheduled. °

## 2014-12-29 ENCOUNTER — Ambulatory Visit: Payer: Self-pay | Admitting: Nurse Practitioner

## 2015-01-05 ENCOUNTER — Other Ambulatory Visit: Payer: Self-pay | Admitting: Family Medicine

## 2015-02-09 ENCOUNTER — Other Ambulatory Visit: Payer: Self-pay | Admitting: Family Medicine

## 2015-03-08 ENCOUNTER — Encounter: Payer: Self-pay | Admitting: Nurse Practitioner

## 2015-03-08 ENCOUNTER — Ambulatory Visit (INDEPENDENT_AMBULATORY_CARE_PROVIDER_SITE_OTHER): Payer: BLUE CROSS/BLUE SHIELD | Admitting: Nurse Practitioner

## 2015-03-08 VITALS — BP 114/70 | HR 62 | Resp 16 | Ht 65.75 in | Wt 182.0 lb

## 2015-03-08 DIAGNOSIS — R87619 Unspecified abnormal cytological findings in specimens from cervix uteri: Secondary | ICD-10-CM | POA: Diagnosis not present

## 2015-03-08 DIAGNOSIS — Z01419 Encounter for gynecological examination (general) (routine) without abnormal findings: Secondary | ICD-10-CM | POA: Diagnosis not present

## 2015-03-08 DIAGNOSIS — Z0000 Encounter for general adult medical examination without abnormal findings: Secondary | ICD-10-CM

## 2015-03-08 HISTORY — DX: Unspecified abnormal cytological findings in specimens from cervix uteri: R87.619

## 2015-03-08 NOTE — Patient Instructions (Signed)

## 2015-03-08 NOTE — Progress Notes (Signed)
33 y.o. [id number] Married  Caucasian Fe here for annual exam.  Menses for 7 days. Heavy for 3 days.  Always using super overnight pad and changing every 2-3 hours.  Ibuprofen X 3 days.  Secondary to menorrhagia has anemia - Now back on oral iron per PCP.  She had PUS 08/20/14 with normal findings. She has been remarried for 7 years.  She had BTL done after last pregnancy and now considering IVF.  She is very much against any treatment for menorrhagia even if not considering another pregnancy.  Patient's last menstrual period was 02/09/2015 (exact date).       Sexually active: Yes.    The current method of family planning is BTL.   Exercising: Yes.    walking Smoker:  yes  Health Maintenance: Pap:  01/20/2008 per pt TDaP:  06/25/2014 Labs: PCP   reports that she has been smoking Cigarettes.  She has been smoking about 1.00 pack per day. She does not have any smokeless tobacco history on file. She reports that she drinks about 1.8 oz of alcohol per week. She reports that she does not use illicit drugs.  Past Medical History  Diagnosis Date  . Depression   . Nystagmus     her whole life, sees [place]  . Anemia     d/t heavy menstrual cycles  . Anxiety   . Dysmenorrhea     Past Surgical History  Procedure Laterality Date  . Cesarean section      2009, 2006, and 2004  . Wisdom tooth extraction      Current Outpatient Prescriptions  Medication Sig Dispense Refill  . buPROPion (WELLBUTRIN SR) 150 MG 12 hr tablet TAKE ONE TABLET BY MOUTH TWICE DAILY 60 tablet 0  . ibuprofen (ADVIL,MOTRIN) 600 MG tablet Take 1 tablet (600 mg total) by mouth every 6 (six) hours as needed for cramping. 30 tablet 5   No current facility-administered medications for this visit.    Family History  Problem Relation Age of Onset  . Colon cancer Father   . Cancer Brother     had hysterectomy at age 85  . Diabetes Maternal Grandmother   . Diabetes Paternal Grandfather   . Cancer Sister 49    Dec from  MVA--but had hysterectomy age 67 ?CA (pt. states mom told her -- sister dx'd with germ induced ovarian ca)  . Hyperlipidemia Mother   . Stroke Maternal Grandfather     ROS:  Pertinent items are noted in HPI.  Otherwise, a comprehensive ROS was negative.  Exam:   BP 114/70 mmHg  Pulse 62  Resp 16  Ht 5' 5.75" (1.67 m)  Wt 182 lb (82.555 kg)  BMI 29.60 kg/m2  LMP 02/09/2015 (Exact Date) Height: 5' 5.75" (167 cm) Ht Readings from Last 3 Encounters:  03/08/15 5' 5.75" (1.67 m)  09/30/14 5' 5.75" (1.67 m)  08/20/14 5\' 6"  (1.676 m)    General appearance: alert, cooperative and appears stated age Head: Normocephalic, without obvious abnormality, atraumatic, nystagmus bilaterally Neck: no adenopathy, supple, symmetrical, trachea midline and thyroid normal to inspection and palpation Lungs: clear to auscultation bilaterally Breasts: normal appearance, no masses or tenderness Heart: regular rate and rhythm Abdomen: soft, non-tender; no masses,  no organomegaly Extremities: extremities normal, atraumatic, no cyanosis or edema Skin: Skin color, texture, turgor normal. No rashes or lesions Lymph nodes: Cervical, supraclavicular, and axillary nodes normal. No abnormal inguinal nodes palpated Neurologic: Grossly normal   Pelvic: External genitalia:  no lesions  Urethra:  normal appearing urethra with no masses, tenderness or lesions              Bartholin's and Skene's: normal                 Vagina: normal appearing vagina with normal color and discharge, no lesions              Cervix: anteverted              Pap taken: Yes.   Bimanual Exam:  Uterus:  normal size, contour, position, consistency, mobility, non-tender              Adnexa: no mass, fullness, tenderness               Rectovaginal: Confirms               Anus:  normal sphincter tone, no lesions  Chaperone present: Yes  A:  Well Woman with normal exam  Menorrhagia - declines any treatment   History of  anemia.   Status post BTL.  Status post C/S x 3.    P:   Reviewed health and wellness pertinent to exam  Pap smear as above  Discussed various treatment options even though brief as she was not interested including OCP and Mirena IUD.  Counseled on breast self exam, adequate intake of calcium and vitamin D, diet and exercise return annually or prn  An After Visit Summary was printed and given to the patient.

## 2015-03-09 NOTE — Progress Notes (Signed)
Encounter reviewed by Dr. Brook Amundson C. Silva.  

## 2015-03-11 LAB — IPS PAP TEST WITH HPV

## 2015-03-17 LAB — IPS HPV GENOTYPING 16/18

## 2015-03-22 ENCOUNTER — Telehealth: Payer: Self-pay | Admitting: Emergency Medicine

## 2015-03-22 DIAGNOSIS — R87810 Cervical high risk human papillomavirus (HPV) DNA test positive: Secondary | ICD-10-CM

## 2015-03-22 NOTE — Telephone Encounter (Signed)
Call to patient. Left message to call back. Ask for Tara Velazquez.

## 2015-03-22 NOTE — Telephone Encounter (Signed)
-----   Message from Kem Boroughs, [place] sent at 03/17/2015  4:52 PM EDT ----- Please let Tara Velazquez know that pap was normal but HPV test was positive - we then did # 16 & 18 genotype and one of them was positive.  We should do a vaginal colpo to make sure everything is OK.

## 2015-03-22 NOTE — Telephone Encounter (Signed)
Patient is returning a call to Sally. °

## 2015-03-22 NOTE — Telephone Encounter (Signed)
Return call to patient. Advised pap smear result abnormal indicating the need for additional evaluation with colposcopy. Brief description of procedure. Patient with BTSP. Colpo scheduled for 03-31-15 at 1000 with Dr Quincy Simmonds. Instructed to take Motrin 800 mg one hour prior with food.  Annual  with [address] 03-08-15. Pap normal with positive HR HPV Positive 16/18.  Routing to provider for final review. Patient agreeable to disposition. Will close encounter.   CC: Dr Quincy Simmonds:  Juluis Rainier.

## 2015-03-24 ENCOUNTER — Other Ambulatory Visit: Payer: Self-pay | Admitting: Family Medicine

## 2015-03-30 ENCOUNTER — Telehealth: Payer: Self-pay | Admitting: Emergency Medicine

## 2015-03-30 NOTE — Telephone Encounter (Signed)
I will be happy to discuss with the patient her pap and HPV results.  If she and decide that it is better for her to reschedule the colposcopy for another day and receive Ativan prior to procedure and have a driver take her home, this is an option.   [place]

## 2015-03-30 NOTE — Telephone Encounter (Addendum)
Patient on the line to discuss coverage of procedure with Scientist, clinical (histocompatibility and immunogenetics). Patient received letter advising her there were no malignancies on her pap smear. Advised patient that the letter she received is correct, but that the letter states that additional testing may have been ordered and it was, this was testing for high risk HPV. Advised she has testing positive for HPV type 16 on her cervix and that additional diagnostic testing is indicated to evaluate for abnormal cells. Patient expresses frustration that she needs to have colposcopy after receiving a letter that tests were normal. We discussed HPV results and the need for colposcopy to evaluate cervix and to test to any abnormal cells that may not be able to be seen in screening testing pap smear. Patient states "You're not going to want to see me tomorrow. I am so angry, I am going to lose it in your office. I already can't control myself while I am there, and this is much worse." Offered to cancel procedure appointment and change to consult appointment to discuss with provider her concerns and address possible anxiety related to procedure and office visit and patient declines states "My husband is already off to watch the kids, there is nothing else you can do." Advised that Dr. Quincy Simmonds will speak with her prior to procedure and she can choose to proceed or not based on conversation if she would like to. Patient declines any offer by this RN to reschedule or change appointment.   Routing to Dr. Quincy Simmonds to review and Lamont Snowball, RN, nursing supervisor.

## 2015-03-30 NOTE — Telephone Encounter (Signed)
I called patient to review benefit. She understands and is agreeable to benefit and arrival time for procedure. Patient then had a question about the labs surrounding the procedure. Transferred to nurse Karen Chafe to discuss.

## 2015-03-30 NOTE — Telephone Encounter (Signed)
Call to patient to review concerns from previous call and options provided by Dr Quincy Simmonds. Patient states all of her questions have been answered. Advised that due to the her concerns over procedure, it is our attempt to make this easier for her. patient states she has no one that can drive her and she cannot reschedule to another time as still would not have anyone other than husband who has to watch her kids. She states that every time we call her we are just making it worse and she started to cry. States she has to go and disconnected call.   Appointment tomorrow morning at 10. Routing to provider. Encounter closed

## 2015-03-31 ENCOUNTER — Encounter: Payer: Self-pay | Admitting: Obstetrics and Gynecology

## 2015-03-31 ENCOUNTER — Ambulatory Visit (INDEPENDENT_AMBULATORY_CARE_PROVIDER_SITE_OTHER): Payer: BLUE CROSS/BLUE SHIELD | Admitting: Obstetrics and Gynecology

## 2015-03-31 DIAGNOSIS — R87810 Cervical high risk human papillomavirus (HPV) DNA test positive: Secondary | ICD-10-CM

## 2015-03-31 NOTE — Progress Notes (Signed)
Subjective:     Patient ID: Tara Velazquez, female   DOB: 1980-10-08, 34 y.o.   MRN: [id number]  HPI  Patient here for colposcopy today.  Pap smear on 03-08-15 revealing normal pap but HR HPV and Pos. #16 Genotype.  Patient unhappy about communication of her pap and HR HPV results. She states she was told her cells were abnormal and that her letter said they were normal.  She then found out that the HR HPV testing is what was abnormal.   Patient has extreme anxiety with doctor visits. She has declined antianxiety medication for procedure and rescheduling for another day when a driver can be with her here.  She states this is not an option today or another day. Just wants to get the colposcopy done.   Review of Systems  LMP:  03-09-15 Contraception:Tubal     Objective:   Physical Exam  Genitourinary:     Patient anxious and teary but appropriate.   Chaperone present for procedure.   Colposcopy.  Consent for procedure.  Speculum placed in vagina.  3% acetic acid placed in vagina.  Colposcopy satisfactory.  ECC performed and sent to pathology.  Biopsy of cervix at 6:00 send to pathology.  Minimal bleeding.  No Monsel's needed.  No complications.     Assessment:     Normal pap and positive HR HPV 16.     Plan:     Instructions and precautions given.  Follow up biopsy results.  I anticipate cotesting in 12 months.

## 2015-03-31 NOTE — Patient Instructions (Signed)
Colposcopy Care After Colposcopy is a procedure in which a special tool is used to magnify the surface of the cervix. A tissue sample (biopsy) may also be taken. This sample will be looked at for cervical cancer or other problems. After the test:  You may have some cramping.  Lie down for a few minutes if you feel lightheaded.   You may have some bleeding which should stop in a few days. HOME CARE  Do not have sex or use tampons for 2 to 3 days or as told.  Only take medicine as told by your doctor.  Continue to take your birth control pills as usual. Finding out the results of your test Ask when your test results will be ready. Make sure you get your test results. GET HELP RIGHT AWAY IF:  You are bleeding a lot or are passing blood clots.  You develop a fever of 102 F (38.9 C) or higher.  You have abnormal vaginal discharge.  You have cramps that do not go away with medicine.  You feel lightheaded, dizzy, or pass out (faint). MAKE SURE YOU:   Understand these instructions.  Will watch your condition.  Will get help right away if you are not doing well or get worse. Document Released: 01/24/2008 Document Revised: 10/30/2011 Document Reviewed: 03/06/2013 [facility] Patient Information 2015 [place], [place]. This information is not intended to replace advice given to you by your health care provider. Make sure you discuss any questions you have with your health care provider.

## 2015-04-01 ENCOUNTER — Encounter: Payer: Self-pay | Admitting: Obstetrics and Gynecology

## 2015-04-02 LAB — IPS OTHER TISSUE BIOPSY

## 2015-04-05 ENCOUNTER — Telehealth: Payer: Self-pay | Admitting: *Deleted

## 2015-04-05 NOTE — Telephone Encounter (Signed)
Patient returns call. Advised Dr Quincy Simmonds would like for her to come in to discuss results.  Patient very upset that about having to come back in instead of being told results "correctly" over the phone. Advised although not urgent, this is something Dr Quincy Simmonds would like to discuss personally with her. Patient then upset about copay and hour long wait. Offered early am appointment if that was better for patient, Friday at [place]. Patient states husband has appointment then. Patient becoming agitated and upset wants another nurse to speak when I was unable to accommodate this, patient disconnected call.

## 2015-04-05 NOTE — Telephone Encounter (Signed)
Call to patient, left message to call back. 

## 2015-04-05 NOTE — Telephone Encounter (Signed)
Returned call to patient. Advised needs to schedule appointment with Dr. Quincy Simmonds to discuss test results. Also confirmed with Dr. Quincy Simmonds that office visit is needed. Advised patient of this and encouraged her to bring someone with her to the appointment on 04/08/15.   Routing to provider for final review.

## 2015-04-05 NOTE — Telephone Encounter (Signed)
-----   Message from Nunzio Cobbs, MD sent at 04/04/2015  5:00 PM EDT ----- Please have patient make an appointment for a talking visit with me this week regarding her colposcopy results.  She will need further treatment for the abnormal cells detected on her colposcopy biopsy.   These were from the ECC, high inside the canal, so it was not possible to see them at the time of the colposcopy. I strongly recommend she have a support person present with her as she is very anxious with her medical visits.   Cc- Patty Gabriela Eves

## 2015-04-05 NOTE — Telephone Encounter (Signed)
Patient wants to speak with Dr. Quincy Simmonds to discuss results.

## 2015-04-08 ENCOUNTER — Ambulatory Visit (INDEPENDENT_AMBULATORY_CARE_PROVIDER_SITE_OTHER): Payer: BLUE CROSS/BLUE SHIELD | Admitting: Obstetrics and Gynecology

## 2015-04-08 ENCOUNTER — Encounter: Payer: Self-pay | Admitting: Obstetrics and Gynecology

## 2015-04-08 VITALS — BP 110/80 | HR 70 | Ht 65.75 in | Wt 182.0 lb

## 2015-04-08 DIAGNOSIS — IMO0002 Reserved for concepts with insufficient information to code with codable children: Secondary | ICD-10-CM

## 2015-04-08 DIAGNOSIS — R896 Abnormal cytological findings in specimens from other organs, systems and tissues: Secondary | ICD-10-CM

## 2015-04-08 DIAGNOSIS — D069 Carcinoma in situ of cervix, unspecified: Secondary | ICD-10-CM

## 2015-04-08 NOTE — Progress Notes (Signed)
Patient ID: Tara Velazquez, female   DOB: [birth year], 34 y.o.   MRN: [id number] GYNECOLOGY  VISIT   HPI: 34 y.o.   Married  Caucasian  female   [phone] with Patient's last menstrual period was 03/09/2015 (exact date).   here to discuss pathology results from colposcopy and treatment options.   Very nervous and worried about potential cancer.   Will see Dr. [facility] in the reproductive endocrinology department next week about potential IVF for pregnancy.  Has had tubal ligation.   GYNECOLOGIC HISTORY: Patient's last menstrual period was 03/09/2015 (exact date). Contraception:Tubal Menopausal hormone therapy: n/a Last mammogram: n/a Last pap smear: 03-08-15 Neg:Pos HR HPV;#16 pos HPV genotype  Colposcopy 03-31-15 ECC at least High-Grade dysplasia,cannot exclude Adenocarcinoma in situ.        OB History    Gravida Para Term Preterm AB TAB SAB Ectopic Multiple Living   3 3 3       3          There are no active problems to display for this patient.   Past Medical History  Diagnosis Date  . Depression   . Nystagmus     her whole life, sees [place]  . Anemia     d/t heavy menstrual cycles  . Anxiety   . Dysmenorrhea     Past Surgical History  Procedure Laterality Date  . Cesarean section      2009, 2006, and 2004  . Wisdom tooth extraction      Current Outpatient Prescriptions  Medication Sig Dispense Refill  . buPROPion (WELLBUTRIN SR) 150 MG 12 hr tablet TAKE ONE TABLET BY MOUTH TWICE DAILY 60 tablet 0  . ferrous sulfate 325 (65 FE) MG tablet Take 325 mg by mouth daily with breakfast.    . ibuprofen (ADVIL,MOTRIN) 600 MG tablet Take 1 tablet (600 mg total) by mouth every 6 (six) hours as needed for cramping. 30 tablet 5   No current facility-administered medications for this visit.     ALLERGIES: Amoxicillin and Coconut flavor  Family History  Problem Relation Age of Onset  . Colon cancer Father   . Cancer Brother     had hysterectomy at age 94  .  Diabetes Maternal Grandmother   . Diabetes Paternal Grandfather   . Cancer Sister 91    Dec from MVA--but had hysterectomy age 78 ?[place] (pt. states mom told her -- sister dx'd with germ induced ovarian ca)  . Hyperlipidemia Mother   . Stroke Maternal Grandfather     Social History   Social History  . Marital Status: Married    Spouse Name: N/A  . Number of Children: N/A  . Years of Education: N/A   Occupational History  . Not on file.   Social History Main Topics  . Smoking status: Current Every Day Smoker -- 1.00 packs/day    Types: Cigarettes  . Smokeless tobacco: Not on file  . Alcohol Use: 1.8 oz/week    3 Standard drinks or equivalent per week     Comment: once every couple of weeks   . Drug Use: No  . Sexual Activity:    Partners: Male    Birth Control/ Protection: Surgical     Comment: Tubal   Other Topics Concern  . Not on file   Social History Narrative   Work or School: homemaker      Home Situation: lives with husband and 3 children      Spiritual Beliefs: none  Lifestyle: walks/jogs 2 times per week/ diet is poor          ROS:  Pertinent items are noted in HPI.  PHYSICAL EXAMINATION:    BP 110/80 mmHg  Pulse 70  Ht 5' 5.75" (1.67 m)  Wt 182 lb (82.555 kg)  BMI 29.60 kg/m2  LMP 03/09/2015 (Exact Date)    General appearance: alert, cooperative and appears stated age  ASSESSMENT  Normal pap and positive HR HPV. Colposcopy 03-31-15 ECC at least High-Grade squamous dysplasia, cannot exclude Adenocarcinoma in situ.  PLAN  Counseled regarding cervical dysplasia - high grade squamous and adenocarcinoma in situ using UpToDate summary for patient.  Discussed options for care including cold knife conization with ECC versus hysterectomy.  Discussed skip lesions of adenocarcinoma in situ and rate of recurrence/progression for adenocarcinoma in situ and adenocarcinoma. Discussed cold knife conization effects on future pregnancy - fibrosis of the  cervix, potential for increased preterm labor and delivery, and increased risk of second trimester loss. Patient will proceed with consultation for potential IVF and office will contact patient back for the end of next week for planning for care. Patient declines GYN ONC consultation.   An After Visit Summary was printed and given to the patient.  _25_____ minutes face to face time of which over 50% was spent in counseling.

## 2015-04-09 ENCOUNTER — Ambulatory Visit: Payer: BLUE CROSS/BLUE SHIELD | Admitting: Obstetrics and Gynecology

## 2015-04-12 ENCOUNTER — Telehealth: Payer: Self-pay | Admitting: Obstetrics and Gynecology

## 2015-04-12 NOTE — Telephone Encounter (Signed)
Ok to proceed with scheduling of cold knife conization of the cervix with ECC for adenocarcinoma of the cervix.  Gay Filler will need to be involved in the surgical scheduling and planning.   I will send precert request in a staff message to our ins. Dept.  Cc- Lamont Snowball

## 2015-04-12 NOTE — Telephone Encounter (Signed)
Thank you for the update!

## 2015-04-12 NOTE — Telephone Encounter (Signed)
Patient says she is calling to schedule a procedure. 

## 2015-04-12 NOTE — Telephone Encounter (Addendum)
Spoke with patient. Patient confirms she is ready to schedule surgery. Advised of office procedure in scheduling of surgeries/procedures. Advised Lamont Snowball (surgery scheduler/Nurse Manager) will contact her as well as she will be contacted by our insurance department with insurance pre-certification information. Patient agreeable to above.   Routing to Dr. Quincy Simmonds JD:[id number] and Mabeline Caras Fast, RN present for phone call.

## 2015-04-12 NOTE — Telephone Encounter (Signed)
Spoke with patient. Patient states that she would like to schedule her procedure at this time. Patient was last seen on 04/08/2015 with Dr.Silva. Discussed hysterectomy versus cone knife conization with ECC. Patient states "I do not want to have either done, but since I have to I guess I am going to have the cone knife." Per OV note patient was to see IVF for consultation before making a decision. Patient states "I cancelled that appointment. I can not handle all this." Advised I will speak with Dr.Silva regarding her decision so that we may proceed with scheduling. Patient states "Do not let Gay Filler call me back. I can not talk to her. Every time we talk it ends bad." Advised will speak with Dr.Silva so that procedure may be scheduled and she will receive return call from office to discuss. Patient is agreeable.  Cc: Thayer Ohm

## 2015-04-12 NOTE — Telephone Encounter (Signed)
Thank you for the update.  Encounter closed. 

## 2015-04-12 NOTE — Telephone Encounter (Signed)
Call transferred to me from business office. Patient requests first available date for procedure. Offered date of 04-27-15 at 24 at [facility]. Patient is agreeable. Advised estimated arrival time of 9 am and hospital will confirm. Surgery consult with Dr Quincy Simmonds 04-19-15 at [facility]. Surgery instruction sheet reviewed and printed copy mailed to patient, see scanned copy. Thayer Ohm, practice administrator, present for call.  Routing to provider for final review. Will close encounter.

## 2015-04-13 ENCOUNTER — Encounter (HOSPITAL_COMMUNITY): Payer: Self-pay | Admitting: *Deleted

## 2015-04-19 ENCOUNTER — Ambulatory Visit (INDEPENDENT_AMBULATORY_CARE_PROVIDER_SITE_OTHER): Payer: BLUE CROSS/BLUE SHIELD | Admitting: Obstetrics and Gynecology

## 2015-04-19 ENCOUNTER — Encounter: Payer: Self-pay | Admitting: Obstetrics and Gynecology

## 2015-04-19 VITALS — BP 140/70 | HR 80 | Ht 65.75 in | Wt 182.0 lb

## 2015-04-19 DIAGNOSIS — F411 Generalized anxiety disorder: Secondary | ICD-10-CM

## 2015-04-19 DIAGNOSIS — D069 Carcinoma in situ of cervix, unspecified: Secondary | ICD-10-CM

## 2015-04-19 DIAGNOSIS — R896 Abnormal cytological findings in specimens from other organs, systems and tissues: Secondary | ICD-10-CM | POA: Diagnosis not present

## 2015-04-19 DIAGNOSIS — IMO0002 Reserved for concepts with insufficient information to code with codable children: Secondary | ICD-10-CM

## 2015-04-19 MED ORDER — ALPRAZOLAM 0.25 MG PO TABS: ORAL_TABLET | ORAL | Status: DC

## 2015-04-19 NOTE — Progress Notes (Signed)
Patient ID: Tara Velazquez, female   DOB: Jan 28, 1981, 34 y.o.   MRN: [id number] GYNECOLOGY  VISIT   HPI: 34 y.o.   Married  Caucasian  female   [phone] with Patient's last menstrual period was 04/06/2015 (exact date).   here for surgical consult.   Last pap smear: 03-08-15 Neg:Pos HR HPV;#16 pos HPV genotype Colposcopy 03-31-15 ECC at least High-Grade dysplasia,cannot exclude Adenocarcinoma in situ.  Cancelled her appointment at [facility] for potential IVF. Wants to wait for results of surgical pathology from cervical conization.   Patient is struggling with anxiety.  Wellbutrin has worked well in the past but is now having panic attacks.  This is occurring recently.  Denies suicidal and homicidal ideation.   PCP - Dr. Colin Benton  GYNECOLOGIC HISTORY: Patient's last menstrual period was 04/06/2015 (exact date). Contraception:Tubal Menopausal hormone therapy: none Last mammogram: n/a Last pap smear: 03-08-15 Neg:Pos HR HPV;#16 pos HPV genotype Colposcopy 03-31-15 ECC at least High-Grade dysplasia,cannot exclude Adenocarcinoma in situ.        OB History    Gravida Para Term Preterm AB TAB SAB Ectopic Multiple Living   3 3 3       3          There are no active problems to display for this patient.   Past Medical History  Diagnosis Date  . Depression   . Nystagmus     her whole life, sees [place]  . Anemia     d/t heavy menstrual cycles  . Anxiety   . Dysmenorrhea   . Abnormal Pap smear of cervix 03-08-15    --normal pap:Pos HR HPV w/#16 Pos genotype--colposcopy ECC at least high grade dysplasia;cannot exclude adenocarcinoma insitu    Past Surgical History  Procedure Laterality Date  . Cesarean section      2009, 2006, and 2004  . Wisdom tooth extraction      Current Outpatient Prescriptions  Medication Sig Dispense Refill  . buPROPion (WELLBUTRIN SR) 150 MG 12 hr tablet TAKE ONE TABLET BY MOUTH TWICE DAILY 60 tablet 0  . ferrous sulfate 325 (65 FE) MG tablet  Take 325 mg by mouth daily with breakfast.    . ibuprofen (ADVIL,MOTRIN) 600 MG tablet Take 1 tablet (600 mg total) by mouth every 6 (six) hours as needed for cramping. 30 tablet 5   No current facility-administered medications for this visit.     ALLERGIES: Amoxicillin and Coconut flavor  Family History  Problem Relation Age of Onset  . Colon cancer Father   . Cancer Brother     had hysterectomy at age 16  . Diabetes Maternal Grandmother   . Diabetes Paternal Grandfather   . Cancer Sister 7    Dec from MVA--but had hysterectomy age 70 ?[place] (pt. states mom told her -- sister dx'd with germ induced ovarian ca)  . Hyperlipidemia Mother   . Stroke Maternal Grandfather     Social History   Social History  . Marital Status: Married    Spouse Name: N/A  . Number of Children: N/A  . Years of Education: N/A   Occupational History  . Not on file.   Social History Main Topics  . Smoking status: Current Every Day Smoker -- 1.00 packs/day    Types: Cigarettes  . Smokeless tobacco: Not on file  . Alcohol Use: 1.8 oz/week    3 Standard drinks or equivalent per week     Comment: once every couple of weeks   . Drug  Use: No  . Sexual Activity:    Partners: Male    Birth Control/ Protection: Surgical     Comment: Tubal   Other Topics Concern  . Not on file   Social History Narrative   Work or School: homemaker      Home Situation: lives with husband and 3 children      Spiritual Beliefs: none      Lifestyle: walks/jogs 2 times per week/ diet is poor          ROS:  Pertinent items are noted in HPI.  PHYSICAL EXAMINATION:    BP 140/70 mmHg  Pulse 80  Ht 5' 5.75" (1.67 m)  Wt 182 lb (82.555 kg)  BMI 29.60 kg/m2  LMP 04/06/2015 (Exact Date)    General appearance: alert, cooperative and appears stated age Head: Normocephalic, without obvious abnormality, atraumatic Neck: no adenopathy, supple, symmetrical, trachea midline and thyroid normal to inspection and  palpation Lungs: clear to auscultation bilaterally Heart: regular rate and rhythm Abdomen: soft, non-tender; bowel sounds normal; no masses,  no organomegaly Extremities: extremities normal, atraumatic, no cyanosis or edema Skin: Skin color, texture, turgor normal. No rashes or lesions Lymph nodes: Cervical, supraclavicular, and axillary nodes normal. No abnormal inguinal nodes palpated Neurologic: Grossly normal  Pelvic: External genitalia:  no lesions              Urethra:  normal appearing urethra with no masses, tenderness or lesions              Bartholins and Skenes: normal                 Vagina: normal appearing vagina with normal color and discharge, no lesions              Cervix: no lesions            Bimanual Exam:  Uterus:  normal size, contour, position, consistency, mobility, non-tender              Adnexa: normal adnexa and no mass, fullness, tenderness          Chaperone was present for exam.  ASSESSMENT  High grade squamous cervical dysplasia.  Adenocarcinoma in situ of cervix. Hx C/S x 3.  Status post BTL.  Anxiety.  On Wellbutrin SR.   PLAN  Counseled regarding cold knife conization of the cervix.  Risks, benefits, and alternatives discussed with the patient.  Risks include but are not limited to bleeding, infection, damage to surrounding organs, cervical scarring making future evaluation of the cervix difficult, increased risk of preterm labor and delivery including second trimester loss, possible missed lesions of adenocarcinoma of the cervix due to skipped lesions, need for further surgery or future hysterectomy, reaction to anesthesia, DVT, PE, and death.  Patient indicates understanding and wishes to proceed.  Rx for Xanax 0.25 mg po tid prn. #30, RF zero.  Patient understands that if she needs future anxiety treatment, she will return to the care of her PCP and have referral if necessary.   An After Visit Summary was printed and given to the  patient.  __25____ minutes face to face time of which over 50% was spent in counseling.

## 2015-04-26 NOTE — H&P (Signed)
Nunzio Cobbs, MD at 04/19/2015 8:56 AM     Status: Signed       Expand All Collapse All   Patient ID: Tara Velazquez, female DOB: 1980/11/08, 34 y.o. MRN: [id number] GYNECOLOGY VISIT  HPI: 34 y.o. Married Caucasian female  [phone] with Patient's last menstrual period was 04/06/2015 (exact date).  here for surgical consult.   Last pap smear: 03-08-15 Neg:Pos HR HPV;#16 pos HPV genotype Colposcopy 03-31-15 ECC at least High-Grade dysplasia,cannot exclude Adenocarcinoma in situ.  Cancelled her appointment at [facility] for potential IVF. Wants to wait for results of surgical pathology from cervical conization.   Patient is struggling with anxiety.  Wellbutrin has worked well in the past but is now having panic attacks.  This is occurring recently.  Denies suicidal and homicidal ideation.   PCP - Dr. Colin Benton  GYNECOLOGIC HISTORY: Patient's last menstrual period was 04/06/2015 (exact date). Contraception:Tubal Menopausal hormone therapy: none Last mammogram: n/a Last pap smear: 03-08-15 Neg:Pos HR HPV;#16 pos HPV genotype Colposcopy 03-31-15 ECC at least High-Grade dysplasia,cannot exclude Adenocarcinoma in situ.   OB History    Gravida Para Term Preterm AB TAB SAB Ectopic Multiple Living   3 3 3       3        There are no active problems to display for this patient.   Past Medical History  Diagnosis Date  . Depression   . Nystagmus     her whole life, sees [place]  . Anemia     d/t heavy menstrual cycles  . Anxiety   . Dysmenorrhea   . Abnormal Pap smear of cervix 03-08-15    --normal pap:Pos HR HPV w/#16 Pos genotype--colposcopy ECC at least high grade dysplasia;cannot exclude adenocarcinoma insitu    Past Surgical History  Procedure Laterality Date  . Cesarean section      2009, 2006, and 2004  . Wisdom tooth extraction      Current  Outpatient Prescriptions  Medication Sig Dispense Refill  . buPROPion (WELLBUTRIN SR) 150 MG 12 hr tablet TAKE ONE TABLET BY MOUTH TWICE DAILY 60 tablet 0  . ferrous sulfate 325 (65 FE) MG tablet Take 325 mg by mouth daily with breakfast.    . ibuprofen (ADVIL,MOTRIN) 600 MG tablet Take 1 tablet (600 mg total) by mouth every 6 (six) hours as needed for cramping. 30 tablet 5   No current facility-administered medications for this visit.     ALLERGIES: Amoxicillin and Coconut flavor  Family History  Problem Relation Age of Onset  . Colon cancer Father   . Cancer Brother     had hysterectomy at age 62  . Diabetes Maternal Grandmother   . Diabetes Paternal Grandfather   . Cancer Sister 85    Dec from MVA--but had hysterectomy age 70 ?[place] (pt. states mom told her -- sister dx'd with germ induced ovarian ca)  . Hyperlipidemia Mother   . Stroke Maternal Grandfather     Social History   Social History  . Marital Status: Married    Spouse Name: N/A  . Number of Children: N/A  . Years of Education: N/A   Occupational History  . Not on file.   Social History Main Topics  . Smoking status: Current Every Day Smoker -- 1.00 packs/day    Types: Cigarettes  . Smokeless tobacco: Not on file  . Alcohol Use: 1.8 oz/week    3 Standard drinks or equivalent per week     Comment:  once every couple of weeks   . Drug Use: No  . Sexual Activity:    Partners: Male    Birth Control/ Protection: Surgical     Comment: Tubal   Other Topics Concern  . Not on file   Social History Narrative   Work or School: homemaker      Home Situation: lives with husband and 3 children      Spiritual Beliefs: none      Lifestyle: walks/jogs 2 times per week/ diet is poor          ROS: Pertinent items are noted in HPI.  PHYSICAL EXAMINATION:    BP 140/70 mmHg  Pulse 80  Ht 5' 5.75" (1.67 m)  Wt 182 lb (82.555 kg)  BMI 29.60 kg/m2  LMP 04/06/2015 (Exact Date)  General appearance: alert, cooperative and appears stated age Head: Normocephalic, without obvious abnormality, atraumatic Neck: no adenopathy, supple, symmetrical, trachea midline and thyroid normal to inspection and palpation Lungs: clear to auscultation bilaterally Heart: regular rate and rhythm Abdomen: soft, non-tender; bowel sounds normal; no masses, no organomegaly Extremities: extremities normal, atraumatic, no cyanosis or edema Skin: Skin color, texture, turgor normal. No rashes or lesions Lymph nodes: Cervical, supraclavicular, and axillary nodes normal. No abnormal inguinal nodes palpated Neurologic: Grossly normal  Pelvic: External genitalia: no lesions  Urethra: normal appearing urethra with no masses, tenderness or lesions  Bartholins and Skenes: normal   Vagina: normal appearing vagina with normal color and discharge, no lesions  Cervix: no lesions   Bimanual Exam: Uterus: normal size, contour, position, consistency, mobility, non-tender  Adnexa: normal adnexa and no mass, fullness, tenderness    Chaperone was present for exam.  ASSESSMENT  High grade squamous cervical dysplasia.  Adenocarcinoma in situ of cervix. Hx C/S x 3.  Status post BTL.  Anxiety. On Wellbutrin SR.   PLAN  Counseled regarding cold knife conization of the cervix. Risks, benefits, and alternatives discussed with the patient. Risks include but are not limited to bleeding, infection, damage to surrounding organs, cervical scarring making future evaluation of the cervix difficult, increased risk of preterm labor and delivery including second trimester loss, possible missed lesions of adenocarcinoma of the cervix due to skipped lesions, need for further surgery or future hysterectomy,  reaction to anesthesia, DVT, PE, and death. Patient indicates understanding and wishes to proceed.  Rx for Xanax 0.25 mg po tid prn. #30, RF zero.  Patient understands that if she needs future anxiety treatment, she will return to the care of her PCP and have referral if necessary.   An After Visit Summary was printed and given to the patient.  __25____ minutes face to face time of which over 50% was spent in counseling.

## 2015-04-27 ENCOUNTER — Ambulatory Visit (HOSPITAL_COMMUNITY): Payer: BLUE CROSS/BLUE SHIELD | Admitting: Anesthesiology

## 2015-04-27 ENCOUNTER — Ambulatory Visit (HOSPITAL_COMMUNITY)
Admission: RE | Admit: 2015-04-27 | Discharge: 2015-04-27 | Disposition: A | Discharge status: Home / Self Care | Payer: BLUE CROSS/BLUE SHIELD | Source: Ambulatory Visit | Attending: Obstetrics and Gynecology | Admitting: Obstetrics and Gynecology

## 2015-04-27 ENCOUNTER — Encounter (HOSPITAL_COMMUNITY)
Admission: RE | Disposition: A | Discharge status: Home / Self Care | Payer: Self-pay | Source: Ambulatory Visit | Attending: Obstetrics and Gynecology

## 2015-04-27 ENCOUNTER — Encounter (HOSPITAL_COMMUNITY): Payer: Self-pay | Admitting: *Deleted

## 2015-04-27 DIAGNOSIS — Z9851 Tubal ligation status: Secondary | ICD-10-CM | POA: Insufficient documentation

## 2015-04-27 DIAGNOSIS — Z91018 Allergy to other foods: Secondary | ICD-10-CM | POA: Diagnosis not present

## 2015-04-27 DIAGNOSIS — F329 Major depressive disorder, single episode, unspecified: Secondary | ICD-10-CM | POA: Diagnosis not present

## 2015-04-27 DIAGNOSIS — F17210 Nicotine dependence, cigarettes, uncomplicated: Secondary | ICD-10-CM | POA: Insufficient documentation

## 2015-04-27 DIAGNOSIS — R87613 High grade squamous intraepithelial lesion on cytologic smear of cervix (HGSIL): Secondary | ICD-10-CM | POA: Diagnosis not present

## 2015-04-27 DIAGNOSIS — H5509 Other forms of nystagmus: Secondary | ICD-10-CM | POA: Diagnosis not present

## 2015-04-27 DIAGNOSIS — F419 Anxiety disorder, unspecified: Secondary | ICD-10-CM | POA: Insufficient documentation

## 2015-04-27 DIAGNOSIS — Z881 Allergy status to other antibiotic agents status: Secondary | ICD-10-CM | POA: Diagnosis not present

## 2015-04-27 DIAGNOSIS — D067 Carcinoma in situ of other parts of cervix: Secondary | ICD-10-CM | POA: Insufficient documentation

## 2015-04-27 HISTORY — PX: CERVICAL CONIZATION W/BX: SHX1330

## 2015-04-27 LAB — CBC
HEMATOCRIT: 40.9 % (ref 36.0–46.0)
Hemoglobin: 13.1 g/dL (ref 12.0–15.0)
MCH: 28.4 pg (ref 26.0–34.0)
MCHC: 32 g/dL (ref 30.0–36.0)
MCV: 88.7 fL (ref 78.0–100.0)
PLATELETS: 170 10*3/uL (ref 150–400)
RBC: 4.61 MIL/uL (ref 3.87–5.11)
RDW: 14 % (ref 11.5–15.5)
WBC: 4.5 10*3/uL (ref 4.0–10.5)

## 2015-04-27 LAB — PREGNANCY, URINE: Preg Test, Ur: NEGATIVE

## 2015-04-27 SURGERY — CONE BIOPSY, CERVIX
Anesthesia: General | Site: Vagina

## 2015-04-27 MED ORDER — IODINE STRONG (LUGOLS) 5 % PO SOLN
ORAL | Status: DC | PRN
  Administered 2015-04-27: 0.1 mL

## 2015-04-27 MED ORDER — DEXAMETHASONE SODIUM PHOSPHATE 10 MG/ML IJ SOLN
INTRAMUSCULAR | Status: AC
  Filled 2015-04-27: qty 1

## 2015-04-27 MED ORDER — SCOPOLAMINE 1 MG/3DAYS TD PT72
1.0000 | MEDICATED_PATCH | Freq: Once | TRANSDERMAL | Status: DC
  Administered 2015-04-27: 1.5 mg via TRANSDERMAL

## 2015-04-27 MED ORDER — MIDAZOLAM HCL 2 MG/2ML IJ SOLN
INTRAMUSCULAR | Status: AC
  Filled 2015-04-27: qty 4

## 2015-04-27 MED ORDER — ACETIC ACID 5 % SOLN
Status: AC
  Filled 2015-04-27: qty 500

## 2015-04-27 MED ORDER — PROPOFOL 10 MG/ML IV BOLUS
INTRAVENOUS | Status: AC
  Filled 2015-04-27: qty 20

## 2015-04-27 MED ORDER — FENTANYL CITRATE (PF) 100 MCG/2ML IJ SOLN: 25.0000 ug | INTRAMUSCULAR | Status: DC | PRN

## 2015-04-27 MED ORDER — HYDROCODONE-ACETAMINOPHEN 7.5-325 MG PO TABS
ORAL_TABLET | ORAL | Status: AC
  Filled 2015-04-27: qty 1

## 2015-04-27 MED ORDER — MEPERIDINE HCL 25 MG/ML IJ SOLN: 6.2500 mg | INTRAMUSCULAR | Status: DC | PRN

## 2015-04-27 MED ORDER — KETOROLAC TROMETHAMINE 30 MG/ML IJ SOLN
INTRAMUSCULAR | Status: AC
Start: 2015-04-27 — End: 2015-04-27
  Filled 2015-04-27: qty 1

## 2015-04-27 MED ORDER — FENTANYL CITRATE (PF) 100 MCG/2ML IJ SOLN
INTRAMUSCULAR | Status: DC | PRN
  Administered 2015-04-27 (×3): 50 ug via INTRAVENOUS

## 2015-04-27 MED ORDER — PROPOFOL 10 MG/ML IV BOLUS
INTRAVENOUS | Status: DC | PRN
  Administered 2015-04-27: 200 mg via INTRAVENOUS

## 2015-04-27 MED ORDER — METOCLOPRAMIDE HCL 5 MG/ML IJ SOLN: 10.0000 mg | Freq: Once | INTRAMUSCULAR | Status: DC | PRN

## 2015-04-27 MED ORDER — CLINDAMYCIN PHOSPHATE 900 MG/50ML IV SOLN
900.0000 mg | Freq: Once | INTRAVENOUS | Status: AC
  Administered 2015-04-27: 900 mg via INTRAVENOUS
  Filled 2015-04-27: qty 50

## 2015-04-27 MED ORDER — MIDAZOLAM HCL 2 MG/2ML IJ SOLN
INTRAMUSCULAR | Status: DC | PRN
  Administered 2015-04-27: 2 mg via INTRAVENOUS

## 2015-04-27 MED ORDER — IODINE STRONG (LUGOLS) 5 % PO SOLN
ORAL | Status: AC
Start: 2015-04-27 — End: 2015-04-27
  Filled 2015-04-27: qty 1

## 2015-04-27 MED ORDER — OXYCODONE-ACETAMINOPHEN 5-325 MG PO TABS
ORAL_TABLET | ORAL | Status: AC
  Filled 2015-04-27: qty 1

## 2015-04-27 MED ORDER — OXYCODONE-ACETAMINOPHEN 5-325 MG PO TABS: 1.0000 | ORAL_TABLET | Freq: Four times a day (QID) | ORAL | Status: DC | PRN

## 2015-04-27 MED ORDER — SCOPOLAMINE 1 MG/3DAYS TD PT72
MEDICATED_PATCH | TRANSDERMAL | Status: AC
  Administered 2015-04-27: 1.5 mg via TRANSDERMAL
  Filled 2015-04-27: qty 1

## 2015-04-27 MED ORDER — DEXAMETHASONE SODIUM PHOSPHATE 10 MG/ML IJ SOLN
INTRAMUSCULAR | Status: DC | PRN
  Administered 2015-04-27: 4 mg via INTRAVENOUS

## 2015-04-27 MED ORDER — HYDROCODONE-ACETAMINOPHEN 7.5-325 MG PO TABS
1.0000 | ORAL_TABLET | Freq: Once | ORAL | Status: AC | PRN
  Administered 2015-04-27: 1 via ORAL

## 2015-04-27 MED ORDER — LIDOCAINE HCL (CARDIAC) 20 MG/ML IV SOLN
INTRAVENOUS | Status: AC
  Filled 2015-04-27: qty 5

## 2015-04-27 MED ORDER — LIDOCAINE HCL (CARDIAC) 20 MG/ML IV SOLN
INTRAVENOUS | Status: DC | PRN
  Administered 2015-04-27: 100 mg via INTRAVENOUS

## 2015-04-27 MED ORDER — LACTATED RINGERS IV SOLN
INTRAVENOUS | Status: DC
  Administered 2015-04-27 (×2): via INTRAVENOUS

## 2015-04-27 MED ORDER — FENTANYL CITRATE (PF) 250 MCG/5ML IJ SOLN
INTRAMUSCULAR | Status: AC
  Filled 2015-04-27: qty 25

## 2015-04-27 MED ORDER — LIDOCAINE-EPINEPHRINE 1 %-1:100000 IJ SOLN
INTRAMUSCULAR | Status: AC
  Filled 2015-04-27: qty 1

## 2015-04-27 MED ORDER — FERRIC SUBSULFATE 259 MG/GM EX SOLN
CUTANEOUS | Status: AC
Start: 2015-04-27 — End: 2015-04-27
  Filled 2015-04-27: qty 8

## 2015-04-27 MED ORDER — ONDANSETRON HCL 4 MG/2ML IJ SOLN
INTRAMUSCULAR | Status: AC
  Filled 2015-04-27: qty 2

## 2015-04-27 SURGICAL SUPPLY — 25 items
APPLICATOR COTTON TIP 6IN STRL (MISCELLANEOUS) ×2 IMPLANT
BLADE SURG 11 STRL SS (BLADE) ×2 IMPLANT
CLOTH BEACON ORANGE TIMEOUT ST (SAFETY) ×2 IMPLANT
CONTAINER PREFILL 10% NBF 60ML (FORM) ×2 IMPLANT
COUNTER NEEDLE 1200 MAGNETIC (NEEDLE) ×2 IMPLANT
ELECT BALL LEEP 5MM RED (ELECTRODE) ×2 IMPLANT
ELECT REM PT RETURN 9FT ADLT (ELECTROSURGICAL) ×2
ELECTRODE REM PT RTRN 9FT ADLT (ELECTROSURGICAL) ×1 IMPLANT
GLOVE BIO SURGEON STRL SZ 6.5 (GLOVE) ×2 IMPLANT
GOWN STRL REUS W/TWL LRG LVL3 (GOWN DISPOSABLE) ×4 IMPLANT
NS IRRIG 1000ML POUR BTL (IV SOLUTION) ×2 IMPLANT
PACK VAGINAL MINOR WOMEN LF (CUSTOM PROCEDURE TRAY) ×2 IMPLANT
PAD OB MATERNITY 4.3X12.25 (PERSONAL CARE ITEMS) ×2 IMPLANT
PENCIL BUTTON HOLSTER BLD 10FT (ELECTRODE) ×2 IMPLANT
SCOPETTES 8  STERILE (MISCELLANEOUS) ×2
SCOPETTES 8 STERILE (MISCELLANEOUS) ×2 IMPLANT
SPONGE SURGIFOAM ABS GEL 12-7 (HEMOSTASIS) IMPLANT
SUT CHROMIC 1 CT1 27 (SUTURE) ×6 IMPLANT
SUT SILK 2 0 SH (SUTURE) ×2 IMPLANT
SUT VIC AB 0 CT1 27 (SUTURE)
SUT VIC AB 0 CT1 27XBRD ANBCTR (SUTURE) IMPLANT
TOWEL OR 17X24 6PK STRL BLUE (TOWEL DISPOSABLE) ×4 IMPLANT
TUBING NON-CON 1/4 X 20 CONN (TUBING) ×2 IMPLANT
WATER STERILE IRR 1000ML POUR (IV SOLUTION) ×2 IMPLANT
YANKAUER SUCT BULB TIP NO VENT (SUCTIONS) ×2 IMPLANT

## 2015-04-27 NOTE — Anesthesia Preprocedure Evaluation (Signed)
Anesthesia Evaluation  Patient identified by MRN, date of birth, ID band Patient awake    Reviewed: Allergy & Precautions, NPO status , Patient's Chart, lab work & pertinent test results  Airway Mallampati: II  TM Distance: >3 FB Neck ROM: Full    Dental no notable dental hx. (+) Teeth Intact   Pulmonary Current Smoker,  breath sounds clear to auscultation  Pulmonary exam normal       Cardiovascular negative cardio ROS Normal cardiovascular examRhythm:Regular Rate:Normal     Neuro/Psych PSYCHIATRIC DISORDERS Anxiety Depression negative neurological ROS     GI/Hepatic negative GI ROS, Neg liver ROS,   Endo/Other  negative endocrine ROS  Renal/GU negative Renal ROS  negative genitourinary   Musculoskeletal negative musculoskeletal ROS (+)   Abdominal   Peds  Hematology  (+) anemia ,   Anesthesia Other Findings   Reproductive/Obstetrics Abnormal pap smear                             Anesthesia Physical Anesthesia Plan  ASA: II  Anesthesia Plan: General   Post-op Pain Management:    Induction: Intravenous  Airway Management Planned: LMA  Additional Equipment:   Intra-op Plan:   Post-operative Plan: Extubation in OR  Informed Consent: I have reviewed the patients History and Physical, chart, labs and discussed the procedure including the risks, benefits and alternatives for the proposed anesthesia with the patient or authorized representative who has indicated his/her understanding and acceptance.   Dental advisory given  Plan Discussed with: CRNA, Anesthesiologist and Surgeon  Anesthesia Plan Comments:         Anesthesia Quick Evaluation

## 2015-04-27 NOTE — Transfer of Care (Signed)
Immediate Anesthesia Transfer of Care Note  Patient: Tara Velazquez  Procedure(s) Performed: Procedure(s): CONIZATION CERVIX WITH BIOPSY with ECC (N/A)  Patient Location: PACU  Anesthesia Type:General  Level of Consciousness: awake, alert , oriented and patient cooperative  Airway & Oxygen Therapy: Patient Spontanous Breathing and Patient connected to nasal cannula oxygen  Post-op Assessment: Report given to RN and Post -op Vital signs reviewed and stable  Post vital signs: Reviewed and stable  Last Vitals:  Filed Vitals:   04/27/15 0951  BP: 119/69  Pulse: 86  Temp: 36.4 C  Resp: 18    Complications: No apparent anesthesia complications

## 2015-04-27 NOTE — Anesthesia Procedure Notes (Signed)
Procedure Name: LMA Insertion Date/Time: 04/27/2015 10:58 AM Performed by: Georgeanne Nim Pre-anesthesia Checklist: Patient identified, Emergency Drugs available, Suction available, Patient being monitored and Timeout performed Patient Re-evaluated:Patient Re-evaluated prior to inductionOxygen Delivery Method: Circle system utilized Preoxygenation: Pre-oxygenation with 100% oxygen Intubation Type: IV induction Ventilation: Mask ventilation without difficulty LMA: LMA inserted LMA Size: 4.0 Number of attempts: 1 Placement Confirmation: positive ETCO2,  CO2 detector and breath sounds checked- equal and bilateral Tube secured with: Tape Dental Injury: Teeth and Oropharynx as per pre-operative assessment

## 2015-04-27 NOTE — Brief Op Note (Addendum)
04/27/2015  11:49 AM  PATIENT:  Tara Velazquez  34 y.o. female  PRE-OPERATIVE DIAGNOSIS:  high grade cervical dysplasia, adenocarcinoma in situ of cervix  POST-OPERATIVE DIAGNOSIS:  high grade cervical dysplasia, adenocarcinoma in situ of cervix  PROCEDURE:  Procedure(s): CONIZATION CERVIX WITH BIOPSY with ECC (N/A)  SURGEON:  Surgeon(s) and Role:    * Brook E Yisroel Ramming, MD - Primary  PHYSICIAN ASSISTANT: NA  ASSISTANTS: none   ANESTHESIA:   LMA  EBL:  Total I/O In: 1200 [I.V.:1200] Out: 175 [Urine:175] Urine output is 125 cc EBL 10 cc  BLOOD ADMINISTERED:none  DRAINS: none   LOCAL MEDICATIONS USED:  NONE  SPECIMEN:  Source of Specimen:  cervical cone, ECC.  DISPOSITION OF SPECIMEN:  PATHOLOGY  COUNTS:  YES  TOURNIQUET:  * No tourniquets in log *  DICTATION: .Other Dictation: Dictation Number    PLAN OF CARE: Discharge to home after PACU  PATIENT DISPOSITION:  PACU - hemodynamically stable.   Delay start of Pharmacological VTE agent (>24hrs) due to surgical blood loss or risk of bleeding: not applicable

## 2015-04-27 NOTE — Progress Notes (Signed)
Update to History and Physical  No marked change in status since office preop visit.   Patient examined.   OK to proceed with cold knife conization of cervix and ECC.

## 2015-04-27 NOTE — Discharge Instructions (Addendum)
Conization of the Cervix, Care After °Refer to this sheet in the next few weeks. These instructions provide you with information on caring for yourself after your procedure. Your health care provider may also give you more specific instructions. Your treatment has been planned according to current medical practices but problems sometimes occur. Call your health care provider if you have any problems or questions after your procedure. °WHAT TO EXPECT AFTER THE PROCEDURE °After your procedure, it is typical to have the following sensations: °· If you had a general anesthetic, you may be groggy for 2-3 hours after the procedure. °· You may have cramps (similar to menstrual cramps) for about 1 week.   °· You may have a bloody discharge or light to moderate bleeding for 1-2 weeks.  The bleeding should not be heavy (for example, it should not soak 1 pad in less than 1 hour). °· You may have a black vaginal discharge that looks similar to coffee grounds. This is from the paste that was applied to the cervix to control bleeding. This is normal. °Recovery may take up to 3 weeks.  °HOME CARE INSTRUCTIONS  °· Arrange for someone to drive you home after the procedure. °· Only take medicines as directed by your health care provider. Do not take aspirin. It can cause bleeding.   °· Take showers for the first week. Do not take baths, swim, or use hot tubs until your health care provider says it is okay.   °· Do not douche, use tampons, or have sexual intercourse until your health care provider says it is okay.   °· Avoid strenuous activities, exercises, and heavy lifting for at least 7-14 days. °· You may resume your normal diet unless your health care provider advises you differently.     °· If you are constipated, you may: °¨ Take a mild laxative as directed by your health care provider.   °¨ Add fruit and bran to your diet.   °¨ Make sure to drink enough fluids to keep your urine clear or pale yellow. °· Keep follow-up  appointments with your health care provider. °SEEK MEDICAL CARE IF:  °· You develop a rash.   °· You are dizzy or lightheaded.   °· You feel nauseous.   °· You develop a bad smelling vaginal discharge. °SEEK IMMEDIATE MEDICAL CARE IF:  °· You have blood clots or bleeding that is heavier than a normal menstrual period (for example, soaking a pad in less than 1 hour) or you develop bright red bleeding.   °· You have a fever over 101°F (38.3°C) or persistent symptoms for more than 2-3 days.   °· You have a fever over 101°F (38.3°C) and your symptoms suddenly get worse. °· You have increasing cramps.   °· You faint.   °· You have pain when urinating. °· You have bloody urine.   °· You start vomiting.   °· Your pain is not relieved with your medicine.   °· Your have severe or worsening pain. °MAKE SURE YOU: °· Understand these instructions. °· Will watch your condition. °· Will get help right away if you are not doing well or get worse. °Document Released: 08/07/2005 Document Revised: 08/12/2013 Document Reviewed: 01/31/2013 °ExitCare® Patient Information ©2015 [employer]. This information is not intended to replace advice given to you by your health care provider. Make sure you discuss any questions you have with your health care provider. ° °

## 2015-04-27 NOTE — Anesthesia Postprocedure Evaluation (Signed)
  Anesthesia Post-op Note  Patient: Tara Velazquez  Procedure(s) Performed: Procedure(s): CONIZATION CERVIX WITH BIOPSY with ECC (N/A)  Patient Location: PACU  Anesthesia Type:General  Level of Consciousness: awake, alert  and oriented  Airway and Oxygen Therapy: Patient Spontanous Breathing  Post-op Pain: none  Post-op Assessment: Post-op Vital signs reviewed, Patient's Cardiovascular Status Stable, Respiratory Function Stable, Patent Airway, No signs of Nausea or vomiting and Pain level controlled              Post-op Vital Signs: Reviewed and stable  Last Vitals:  Filed Vitals:   04/27/15 1145  BP: 115/82  Pulse:   Temp: 36.9 C  Resp:     Complications: No apparent anesthesia complications

## 2015-04-28 ENCOUNTER — Encounter (HOSPITAL_COMMUNITY): Payer: Self-pay | Admitting: Obstetrics and Gynecology

## 2015-04-28 NOTE — Op Note (Signed)
Tara Velazquez, ATKIN NO.:  [id number]  MEDICAL RECORD NO.:  [id number]  LOCATION:  [facility]                          FACILITY:  [facility]  PHYSICIAN:  Lenard Galloway, M.D.   DATE OF BIRTH:  [birth year]  DATE OF PROCEDURE:  04/27/2015 DATE OF DISCHARGE:  04/27/2015                              OPERATIVE REPORT   PREOPERATIVE DIAGNOSES:  High grade cervical dysplasia, adenocarcinoma in situ of the cervix.  POSTOPERATIVE DIAGNOSES:  High-grade cervical dysplasia, adenocarcinoma in situ of the cervix.  PROCEDURES:  Cold knife conization of the cervix, endocervical curettage.  SURGEON:  Lenard Galloway, M.D.  ANESTHESIA:  LMA anesthetic.  IV FLUIDS:  1200 mL Ringer's lactate.  EBL:  10 mL.  URINE OUTPUT:  125 mL.  COMPLICATIONS:  None.  INDICATIONS FOR THE PROCEDURE:  The patient is a 34 year old, gravida 3, para 3, Caucasian female, status post bilateral tubal ligation, who presented to the office for routine gynecologic exam and was noted to have a positive high risk HPV status on her Pap smear which had normal cytology.  The patient tested positive for high-risk HPV subtype #16. Colposcopy performed on March 31, 2015, documented a biopsy specimen at the 6 o'clock position which showed mucus with inflammatory cells, and no evidence of mucosa present.  The endocervical curettage documented high grade squamous dysplasia and adenocarcinoma in situ.  The patient was counseled regarding her options for care including hysterectomy versus conization of the cervix.  The patient prefers future childbearing and wishes to proceed with cold knife conization.  A plan is now made to proceed with a cold knife conization with endocervical curettage after risks, benefits, and alternatives are reviewed.  FINDINGS:  The cervix demonstrated no gross lesions.  Lugol staining documented no evidence of decreased uptake.  SPECIMEN:  The cervical conization specimen is sent to  Pathology with a suture marking the 12 o'clock position at the exocervix.  A separate specimen of endocervical curettings was sent to Pathology.  DESCRIPTION OF PROCEDURE:  The patient was reidentified in the preoperative hold area.  The patient received clindamycin 900 mg IV for antibiotic prophylaxis, and she received both TED hose and PAS stockings for DVT prophylaxis.  In the operating room, the patient was placed in the dorsal lithotomy position in the [place], and an LMA anesthetic was then administered.  The patient's vagina, vulva, and perineum were then sterilely prepped, and she was catheterized of urine.  She was sterilely draped.  A weighted speculum was placed inside the vagina and a Deaver retractor was used anteriorly to expose the cervix.  Figure-of-eight sutures of #1 chromic were placed along the descending branches of the uterine artery at the 3 and 9 o'clock positions.  The sutures were held. A scalpel on an angled scalpel holder was then used to excise the cone specimen of the cervix which was performed without difficulty.  The specimen was marked with a suture at the 12 o'clock position. Hemostasis was then created with monopolar cautery.  Endocervical curettage was performed with a Kevorkian curette at this time, and this tissue was sent to Pathology separately.  Hemostasis was created at this time using monopolar  cautery along the inside of the conization bed.  This did not include the cervical os region.  An additional chromic suture of #1 chromic using a Sturmdorf- type fashion was used at the 6 o'clock position to create good hemostasis.  Monsel's was then placed along the cervical conization bed. Hemostasis was good at this time.  All of the suture tags were cut and the vaginal instruments were removed.  The patient was awakened and escorted to the recovery room in stable condition.  There were no complications.  All needle, instrument, and sponge  counts were correct.     Lenard Galloway, M.D.     BES/MEDQ  D:  04/27/2015  T:  04/28/2015  Job:  [id number]

## 2015-04-29 ENCOUNTER — Telehealth: Payer: Self-pay | Admitting: Obstetrics and Gynecology

## 2015-04-29 NOTE — Telephone Encounter (Signed)
Phone call to discuss results of cold knife conization.  Conization specimen - CIN II. No adenocarcinoma in situ seen.  No cancer seen.  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   I discussed options for care with patient: Close observations with colposcopy and pap in 3 months, repeat conization of the cervix, or hysterectomy.   I did also offer for the patient to have a second opinion with GYN ONC at the [facility].   Questions invited and answered.   Patient will consider her options and keep her 2 week appointment for her first post op check.

## 2015-04-30 ENCOUNTER — Telehealth: Payer: Self-pay | Admitting: *Deleted

## 2015-04-30 NOTE — Telephone Encounter (Signed)
Patient says Dr. Quincy Simmonds recommended that she have a hysterectomy, patient is wanting to know which one she is recommending her to have.   Best # to reach [phone]

## 2015-04-30 NOTE — Telephone Encounter (Signed)
Patient spoke with Dr.Silva yesterday regarding results from cone knife conization. Please see note below. Patient calling asking what kind of hysterectomy Dr.Silva is recommending. Routing to Dr.Silva for review.  Nunzio Cobbs, MD at 04/29/2015 12:03 PM     Status: Signed       Expand All Collapse All   Phone call to discuss results of cold knife conization.  Conization specimen - CIN II. No adenocarcinoma in situ seen. No cancer seen.  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   I discussed options for care with patient: Close observations with colposcopy and pap in 3 months, repeat conization of the cervix, or hysterectomy.   I did also offer for the patient to have a second opinion with GYN ONC at the [facility].   Questions invited and answered.   Patient will consider her options and keep her 2 week appointment for her first post op check.

## 2015-05-03 NOTE — Telephone Encounter (Signed)
Spoke with patient. Advised of message as seen below from [place]. Patient is agreeable and verbalizes understanding. Patient would like to keep thinking about her options and discuss this with Dr.Silva at her follow up appointment on 05/14/2015.  Routing to provider for final review. Patient agreeable to disposition. Will close encounter.

## 2015-05-03 NOTE — Telephone Encounter (Signed)
If patient had a hysterectomy, I would recommend a robotic total laparoscopic hysterectomy (meaning removal of the uterus and cervix) and also a bilateral salpingectomy (removal of the fallopian tubes) and cystoscopy (looking inside the bladder after the hysterectomy is complete).   Patient may need the surgery to be done through an abdominal incision is she has a lot of scar tissue from her Cesarean Sections.   Thank you.

## 2015-05-05 ENCOUNTER — Telehealth: Payer: Self-pay | Admitting: Obstetrics and Gynecology

## 2015-05-05 NOTE — Telephone Encounter (Signed)
Spoke with patient. Patient states that she has been reading online about hysterectomys. Read that the "cutting of the uterus can spread the cancer cells." Asking if since she has "pre-cancerous" cells this could happen. Advised patient this is not a concern with her surgery and the type of cells she has. Advised Dr.Silva will be able to visualize the surrounding tissue as well to ensure nothing further is occuring. Advised with the cell changes she has this is unlikely. Advised I will also speak with Dr.Silva and if she has any additional information I will return call. Patient is agreeable. Patient states that she would like to proceed with the hysterectomy at this time. Has a follow up appointment from conization on 9/23 with Dr.Silva. Advised I will let Dr.Silva know what she has decided so we may move forward. Patient is agreeable.

## 2015-05-05 NOTE — Telephone Encounter (Signed)
Patient was told to call when she has made a decision. Patient says she has been talking with Verline Lema about this issue.

## 2015-05-06 NOTE — Telephone Encounter (Signed)
Spoke with patient regarding surgery benefit. Patient understood and agreeable. Patient aware this is an estimate of benefits for professional only. This does not include hospital charges and she will be contacted separately by pre-service center regarding charges for hospital. Patient aware of $250 cancellation fee. Patient understands and agreeable. Patient ready to speak with nurse regarding specifics and scheduling. Patient transferred to [facility] during call.

## 2015-05-06 NOTE — Telephone Encounter (Signed)
1202: Late entry:  Spoke with patient. She is ready to schedule surgery as soon as Dr. Quincy Simmonds allows. Patient has family coming in from out of town to assist.  She is scheduled for 06/22/15 at 0730. [facility] or as directed by Pre-op nurse.   Pre and post op appointments scheduled.  Patient has post op appointment this week scheduled with Dr. Quincy Simmonds.   Will give pre-op instructions to patient after appointment this week.   Routing to provider for final review. Patient agreeable to disposition. Will close encounter.

## 2015-05-06 NOTE — Telephone Encounter (Signed)
Ok to proceed forward with planning for a robotic total laparoscopic hysterectomy with bilateral salpingectomy and cystoscopy, possible total abdominal hysterectomy.  Diagnosis - CIN III. History of 3 prior Cesarean Sections.   Time needed 3.5 hours.   Need to wait until at least the end of October to do surgery.   Dr. Sabra Heck assisting.   Cc- Kerry Hough, Tracy Fast  * I put high importance just to distinguish this as a surgical precert.

## 2015-05-13 ENCOUNTER — Encounter: Payer: Self-pay | Admitting: Emergency Medicine

## 2015-05-14 ENCOUNTER — Ambulatory Visit (INDEPENDENT_AMBULATORY_CARE_PROVIDER_SITE_OTHER): Payer: BLUE CROSS/BLUE SHIELD | Admitting: Obstetrics and Gynecology

## 2015-05-14 ENCOUNTER — Encounter: Payer: Self-pay | Admitting: Obstetrics and Gynecology

## 2015-05-14 VITALS — BP 92/60 | HR 76 | Resp 16 | Ht 65.75 in | Wt 185.0 lb

## 2015-05-14 DIAGNOSIS — D069 Carcinoma in situ of cervix, unspecified: Secondary | ICD-10-CM | POA: Diagnosis not present

## 2015-05-14 NOTE — Progress Notes (Signed)
GYNECOLOGY  VISIT   HPI: 34 y.o.   Married  Caucasian  female   [phone] with Patient's last menstrual period was 05/06/2015 (approximate).   here for   Post Op 04/27/15  CONIZATION CERVIX WITH BIOPSY with ECC. Final pathology showed CIN II of conization and CIN II/III of the ECC. No bleeding.  Some pain for a few days following surgery.   Proceeding with hysterectomy on June 22, 2015. Asking about morcellation and spread of cancer cells.   Declines future childbearing.   GYNECOLOGIC HISTORY: Patient's last menstrual period was 05/06/2015 (approximate). Contraception: Tubal ligation  Menopausal hormone therapy: None Last mammogram: Never Last pap smear: 03/08/15 Neg. HR HPV:+Detected.        OB History    Gravida Para Term Preterm AB TAB SAB Ectopic Multiple Living   3 3 3       3          There are no active problems to display for this patient.   Past Medical History  Diagnosis Date  . Depression   . Nystagmus     her whole life, sees [place]  . Anemia     d/t heavy menstrual cycles  . Anxiety   . Dysmenorrhea   . Abnormal Pap smear of cervix 03-08-15    --normal pap:Pos HR HPV w/#16 Pos genotype--colposcopy ECC at least high grade dysplasia;cannot exclude adenocarcinoma insitu    Past Surgical History  Procedure Laterality Date  . Cesarean section      2009, 2006, and 2004  . Wisdom tooth extraction    . Cervical conization w/bx N/A 04/27/2015    Procedure: CONIZATION CERVIX WITH BIOPSY with ECC;  Surgeon: Nunzio Cobbs, MD;  Location: [facility];  Service: Gynecology;  Laterality: N/A;    Current Outpatient Prescriptions  Medication Sig Dispense Refill  . ALPRAZolam (XANAX) 0.25 MG tablet Take one tablet by mouth up to three times a day as needed for anxiety. 30 tablet 0  . buPROPion (WELLBUTRIN SR) 150 MG 12 hr tablet TAKE ONE TABLET BY MOUTH TWICE DAILY 60 tablet 0  . ferrous sulfate 325 (65 FE) MG tablet Take 325 mg by mouth daily with  breakfast.    . ibuprofen (ADVIL,MOTRIN) 600 MG tablet Take 1 tablet (600 mg total) by mouth every 6 (six) hours as needed for cramping. 30 tablet 5  . oxyCODONE-acetaminophen (PERCOCET) 5-325 MG per tablet Take 1 tablet by mouth every 6 (six) hours as needed. use only as much as needed to relieve pain (Patient not taking: Reported on 05/14/2015) 15 tablet 0   No current facility-administered medications for this visit.     ALLERGIES: Amoxicillin and Coconut flavor  Family History  Problem Relation Age of Onset  . Colon cancer Father   . Cancer Brother     had hysterectomy at age 35  . Diabetes Maternal Grandmother   . Diabetes Paternal Grandfather   . Cancer Sister 25    Dec from MVA--but had hysterectomy age 28 ?CA (pt. states mom told her -- sister dx'd with germ induced ovarian ca)  . Hyperlipidemia Mother   . Stroke Maternal Grandfather     Social History   Social History  . Marital Status: Married    Spouse Name: N/A  . Number of Children: N/A  . Years of Education: N/A   Occupational History  . Not on file.   Social History Main Topics  . Smoking status: Current Every Day Smoker -- 1.00  packs/day    Types: Cigarettes  . Smokeless tobacco: Never Used  . Alcohol Use: 1.8 oz/week    3 Standard drinks or equivalent per week     Comment: once every couple of weeks   . Drug Use: No  . Sexual Activity:    Partners: Male    Birth Control/ Protection: Surgical     Comment: Tubal   Other Topics Concern  . Not on file   Social History Narrative   Work or School: homemaker      Home Situation: lives with husband and 3 children      Spiritual Beliefs: none      Lifestyle: walks/jogs 2 times per week/ diet is poor          ROS:  Pertinent items are noted in HPI.  PHYSICAL EXAMINATION:    BP 92/60 mmHg  Pulse 76  Resp 16  Ht 5' 5.75" (1.67 m)  Wt 185 lb (83.915 kg)  BMI 30.09 kg/m2  LMP 05/06/2015 (Approximate)    General appearance: alert, cooperative  and appears stated age  Pelvic: External genitalia:  no lesions              Urethra:  normal appearing urethra with no masses, tenderness or lesions              Bartholins and Skenes: normal                 Vagina: normal appearing vagina with normal color and discharge, no lesions              Cervix: consistent with cold knife conization.  Sutures absent.               Bimanual Exam:  Uterus:  normal size, contour, position, consistency, mobility, non-tender              Adnexa: normal adnexa and no mass, fullness, tenderness        Chaperone was present for exam.  ASSESSMENT  Status post cold knife conization of the cervix with ECC.  Final pathology showing CIN II and III on ECC. No adenocarcinoma in situ of cervix. Hx Cesarean Section x 3.  Status post BTL.  PLAN  Counseled regarding options for close follow up with colposcopy and pap in 3 months, reconization of cervix, robotic laparoscopic hysterectomy, GYN ONC consultation.  Patient wanting to proceed with hysterectomy.  I did discuss the potential risks of bleeding, infection, damage to surrounding organs, cystotomy, reaction to anesthesia, pneumonias, DVT, PE, death, need for reoperation, continued abnormal paps of the vagina resulting in further treatment including future surgery, and inability to complete the procedure laparoscopically which could result in laparotomy.  I discussed the morcellation issue and advised the patient that this not the technique used for her surgery and that currently there is no indication of cancer.   An After Visit Summary was printed and given to the patient.  ___25___ minutes face to face time of which over 50% was spent in counseling regarding surgical care for hysterectomy.  This was separate from the patient's post op visit today.

## 2015-05-20 ENCOUNTER — Encounter: Payer: Self-pay | Admitting: Emergency Medicine

## 2015-05-20 ENCOUNTER — Telehealth: Payer: Self-pay | Admitting: Emergency Medicine

## 2015-05-20 NOTE — Telephone Encounter (Signed)
Message left to return call to [place] at [phone] to give pre-op instructions.

## 2015-05-20 NOTE — Telephone Encounter (Signed)
Spoke with patient and she is given pre-op instructions. Bowel prep instructions given. Patient verbalized understanding.  She will follow up with Dr. Quincy Simmonds for pre-op appointment as scheduled for 06/11/15 at 1030.  Letter to home with written instructions.  Routing to provider for final review. Patient agreeable to disposition. Will close encounter.

## 2015-05-22 ENCOUNTER — Other Ambulatory Visit: Payer: Self-pay | Admitting: Family Medicine

## 2015-06-10 ENCOUNTER — Telehealth: Payer: Self-pay | Admitting: Obstetrics and Gynecology

## 2015-06-10 NOTE — Telephone Encounter (Signed)
Call to patient, appointment rescheduled to 1245 tomorrow afternoon.  Routing to provider for final review. Patient agreeable to disposition. Will close encounter.

## 2015-06-10 NOTE — Telephone Encounter (Signed)
DR/CX appointment for 06/11/15. Please call patient to schedule pre op consult.

## 2015-06-11 ENCOUNTER — Ambulatory Visit (INDEPENDENT_AMBULATORY_CARE_PROVIDER_SITE_OTHER): Payer: BLUE CROSS/BLUE SHIELD | Admitting: Obstetrics and Gynecology

## 2015-06-11 ENCOUNTER — Encounter: Payer: Self-pay | Admitting: Obstetrics and Gynecology

## 2015-06-11 ENCOUNTER — Institutional Professional Consult (permissible substitution): Payer: BLUE CROSS/BLUE SHIELD | Admitting: Obstetrics and Gynecology

## 2015-06-11 VITALS — BP 128/76 | HR 80 | Ht 65.75 in | Wt 189.0 lb

## 2015-06-11 DIAGNOSIS — D069 Carcinoma in situ of cervix, unspecified: Secondary | ICD-10-CM

## 2015-06-11 MED ORDER — ALPRAZOLAM 0.25 MG PO TABS: ORAL_TABLET | ORAL | Status: DC

## 2015-06-11 NOTE — Patient Instructions (Addendum)
Your procedure is scheduled on: Tuesday, Nov. 1, 2016  Enter through the [employer] [facility] at:  6:00 A.M.  Pick up the phone at the desk and dial [phone].  Call this number if you have problems the morning of surgery: [phone].  Remember: Do NOT eat food or drink after:  MIDNIGHT Monday, Jun 21, 2015 Take these medicines the morning of surgery with a SIP OF WATER: WELLBUTRIN, XANAX IF NEEDED  Do NOT wear jewelry (body piercing), metal hair clips/bobby pins, make-up, or nail polish. Do NOT wear lotions, powders, or perfumes.  You may wear deoderant. Do NOT shave for 48 hours prior to surgery. Do NOT bring valuables to the hospital. Contacts, dentures, or bridgework may not be worn into surgery. Leave suitcase in car.  After surgery it may be brought to your room.  For patients admitted to the hospital, checkout time is 11:00 AM the day of discharge.

## 2015-06-11 NOTE — Progress Notes (Signed)
Patient ID: Tara Velazquez, female   DOB: 1981/03/19, 34 y.o.   MRN: [id number] GYNECOLOGY  VISIT   HPI: 34 y.o.   Married  Caucasian  female   [phone] with Patient's last menstrual period was 06/06/2015 (exact date).   here for sugical consult.   Scheduled for robotic hysterectomy for CIN III.  Status post cold knife conization for possible adenocarcinoma noted at the time of colposcopy.  Final pathology on conization showed CIN II on cervical cone and CIN II and III on endocervical curettage. Pap originally was normal with positive HR HPV.   Hx Cesarean Section x 3.   Declines future childbearing.   Using Xanax every morning this week.  Has only 2 more.  Would like a few more.   GYNECOLOGIC HISTORY: Patient's last menstrual period was 06/06/2015 (exact date). Contraception:Tubal Menopausal hormone therapy: n/a Last mammogram: n/a Last pap smear: 03-08-15 Neg:Pos HR HPV;Pos #16 genotype;colposcopy 03-31-15 ECC at least HGSIL, cannot exclude Adenocarcinoma in situ. Conization of Cervix 04-27-15 showing CIN II of cervix and CIN II/III of ECC.        OB History    Gravida Para Term Preterm AB TAB SAB Ectopic Multiple Living   3 3 3       3          There are no active problems to display for this patient.   Past Medical History  Diagnosis Date  . Depression   . Nystagmus     her whole life, sees [place]  . Anemia     d/t heavy menstrual cycles  . Anxiety   . Dysmenorrhea   . Abnormal Pap smear of cervix 03-08-15    --normal pap:Pos HR HPV w/#16 Pos genotype--colposcopy ECC at least high grade dysplasia;cannot exclude adenocarcinoma insitu    Past Surgical History  Procedure Laterality Date  . Cesarean section      2009, 2006, and 2004  . Wisdom tooth extraction    . Cervical conization w/bx N/A 04/27/2015    Procedure: CONIZATION CERVIX WITH BIOPSY with ECC;  Surgeon: Nunzio Cobbs, MD;  Location: [facility];  Service: Gynecology;  Laterality: N/A;     Current Outpatient Prescriptions  Medication Sig Dispense Refill  . ALPRAZolam (XANAX) 0.25 MG tablet Take one tablet by mouth up to three times a day as needed for anxiety. (Patient taking differently: Take 0.25 mg by mouth 3 (three) times daily as needed for anxiety. Take one tablet by mouth up to three times a day as needed for anxiety.) 30 tablet 0  . buPROPion (WELLBUTRIN SR) 150 MG 12 hr tablet TAKE ONE TABLET BY MOUTH TWICE DAILY 60 tablet 0  . ferrous sulfate 325 (65 FE) MG tablet Take 325 mg by mouth daily with breakfast.    . ibuprofen (ADVIL,MOTRIN) 600 MG tablet Take 1 tablet (600 mg total) by mouth every 6 (six) hours as needed for cramping. 30 tablet 5  . oxyCODONE-acetaminophen (PERCOCET) 5-325 MG per tablet Take 1 tablet by mouth every 6 (six) hours as needed. use only as much as needed to relieve pain (Patient not taking: Reported on 06/11/2015) 15 tablet 0   No current facility-administered medications for this visit.     ALLERGIES: Amoxicillin and Coconut flavor  Family History  Problem Relation Age of Onset  . Colon cancer Father   . Cancer Brother     had hysterectomy at age 39  . Diabetes Maternal Grandmother   . Diabetes Paternal Grandfather   .  Cancer Sister 63    Dec from MVA--but had hysterectomy age 73 ?[place] (pt. states mom told her -- sister dx'd with germ induced ovarian ca)  . Hyperlipidemia Mother   . Stroke Maternal Grandfather     Social History   Social History  . Marital Status: Married    Spouse Name: N/A  . Number of Children: N/A  . Years of Education: N/A   Occupational History  . Not on file.   Social History Main Topics  . Smoking status: Current Every Day Smoker -- 1.00 packs/day    Types: Cigarettes  . Smokeless tobacco: Never Used  . Alcohol Use: 1.8 oz/week    3 Standard drinks or equivalent per week     Comment: once every couple of weeks   . Drug Use: No  . Sexual Activity:    Partners: Male    Birth Control/  Protection: Surgical     Comment: Tubal   Other Topics Concern  . Not on file   Social History Narrative   Work or School: homemaker      Home Situation: lives with husband and 3 children      Spiritual Beliefs: none      Lifestyle: walks/jogs 2 times per week/ diet is poor          ROS:  Pertinent items are noted in HPI.  PHYSICAL EXAMINATION:    BP 128/76 mmHg  Pulse 80  Ht 5' 5.75" (1.67 m)  Wt 189 lb (85.73 kg)  BMI 30.74 kg/m2  LMP 06/06/2015 (Exact Date)    General appearance: alert, cooperative and appears stated age Head: Normocephalic, without obvious abnormality, atraumatic Neck: no adenopathy, supple, symmetrical, trachea midline and thyroid normal to inspection and palpation Lungs: clear to auscultation bilaterally Heart: regular rate and rhythm Abdomen: Pfannenstiel incision, soft, non-tender; bowel sounds normal; no masses,  no organomegaly Extremities: extremities normal, atraumatic, no cyanosis or edema Skin: Skin color, texture, turgor normal. No rashes or lesions Lymph nodes: Cervical, supraclavicular, and axillary nodes normal. No abnormal inguinal nodes palpated Neurologic: Grossly normal  Pelvic: External genitalia:  no lesions              Urethra:  normal appearing urethra with no masses, tenderness or lesions              Bartholins and Skenes: normal                 Vagina: normal appearing vagina with normal color and discharge, no lesions              Cervix: consistent wiht LEEP.  Menstrual flow - light..  No decensus of uterus or cervix.              Bimanual Exam:  Uterus:  normal size, contour, position, consistency, mobility, non-tender              Adnexa: normal adnexa and no mass, fullness, tenderness             Chaperone was present for exam.  ASSESSMENT  CIN III.  Status post cold knife conization with ECC showing CIN III. Status post Cesarean Section x 3.  Anxiety.   PLAN  Proceed with robotic total laparoscopic  hysterectomy, bilateral salpingectomy, and cystoscopy.  Risks, benefits and alternatives discussed with patient who wishes to proceed.  Discussed again the possibility of laparotomy.  Discussed again the possibility of continued abnormal paps and persistent HPV.  Rx for Xanax.  #15, RF  zero.   An After Visit Summary was printed and given to the patient.  __25____ minutes face to face time of which over 50% was spent in counseling.

## 2015-06-14 ENCOUNTER — Encounter (HOSPITAL_COMMUNITY): Payer: Self-pay

## 2015-06-14 ENCOUNTER — Encounter (HOSPITAL_COMMUNITY)
Admission: RE | Admit: 2015-06-14 | Discharge: 2015-06-14 | Disposition: A | Discharge status: Home / Self Care | Payer: BLUE CROSS/BLUE SHIELD | Source: Ambulatory Visit | Attending: Obstetrics and Gynecology | Admitting: Obstetrics and Gynecology

## 2015-06-14 DIAGNOSIS — Z01818 Encounter for other preprocedural examination: Secondary | ICD-10-CM | POA: Insufficient documentation

## 2015-06-14 DIAGNOSIS — D069 Carcinoma in situ of cervix, unspecified: Secondary | ICD-10-CM | POA: Insufficient documentation

## 2015-06-14 HISTORY — DX: Malignant (primary) neoplasm, unspecified: C80.1

## 2015-06-14 LAB — CBC
HCT: 38.8 % (ref 36.0–46.0)
Hemoglobin: 12.6 g/dL (ref 12.0–15.0)
MCH: 29 pg (ref 26.0–34.0)
MCHC: 32.5 g/dL (ref 30.0–36.0)
MCV: 89.2 fL (ref 78.0–100.0)
PLATELETS: 224 10*3/uL (ref 150–400)
RBC: 4.35 MIL/uL (ref 3.87–5.11)
RDW: 13.6 % (ref 11.5–15.5)
WBC: 5.3 10*3/uL (ref 4.0–10.5)

## 2015-06-14 LAB — BASIC METABOLIC PANEL
Anion gap: 3 — ABNORMAL LOW (ref 5–15)
BUN: 12 mg/dL (ref 6–20)
CALCIUM: 8.9 mg/dL (ref 8.9–10.3)
CO2: 27 mmol/L (ref 22–32)
CREATININE: 0.82 mg/dL (ref 0.44–1.00)
Chloride: 108 mmol/L (ref 101–111)
Glucose, Bld: 83 mg/dL (ref 65–99)
Potassium: 4 mmol/L (ref 3.5–5.1)
SODIUM: 138 mmol/L (ref 135–145)

## 2015-06-21 MED ORDER — CIPROFLOXACIN IN D5W 400 MG/200ML IV SOLN
400.0000 mg | INTRAVENOUS | Status: AC
  Administered 2015-06-22: 400 mg via INTRAVENOUS
  Filled 2015-06-21: qty 200

## 2015-06-21 MED ORDER — CLINDAMYCIN PHOSPHATE 900 MG/50ML IV SOLN
900.0000 mg | INTRAVENOUS | Status: AC
  Administered 2015-06-22: 900 mg via INTRAVENOUS
  Filled 2015-06-21: qty 50

## 2015-06-22 ENCOUNTER — Ambulatory Visit (HOSPITAL_COMMUNITY): Payer: BLUE CROSS/BLUE SHIELD | Admitting: Anesthesiology

## 2015-06-22 ENCOUNTER — Encounter (HOSPITAL_COMMUNITY)
Admission: RE | Disposition: A | Discharge status: Home / Self Care | Payer: Self-pay | Source: Ambulatory Visit | Attending: Obstetrics and Gynecology

## 2015-06-22 ENCOUNTER — Observation Stay (HOSPITAL_COMMUNITY)
Admission: RE | Admit: 2015-06-22 | Discharge: 2015-06-23 | Disposition: A | Discharge status: Home / Self Care | Payer: BLUE CROSS/BLUE SHIELD | Source: Ambulatory Visit | Attending: Obstetrics and Gynecology | Admitting: Obstetrics and Gynecology

## 2015-06-22 ENCOUNTER — Encounter (HOSPITAL_COMMUNITY): Payer: Self-pay | Admitting: Anesthesiology

## 2015-06-22 DIAGNOSIS — F329 Major depressive disorder, single episode, unspecified: Secondary | ICD-10-CM | POA: Diagnosis not present

## 2015-06-22 DIAGNOSIS — F17210 Nicotine dependence, cigarettes, uncomplicated: Secondary | ICD-10-CM | POA: Diagnosis not present

## 2015-06-22 DIAGNOSIS — D069 Carcinoma in situ of cervix, unspecified: Principal | ICD-10-CM | POA: Insufficient documentation

## 2015-06-22 DIAGNOSIS — Z90710 Acquired absence of both cervix and uterus: Secondary | ICD-10-CM | POA: Diagnosis present

## 2015-06-22 DIAGNOSIS — F419 Anxiety disorder, unspecified: Secondary | ICD-10-CM | POA: Diagnosis not present

## 2015-06-22 DIAGNOSIS — Z881 Allergy status to other antibiotic agents status: Secondary | ICD-10-CM | POA: Insufficient documentation

## 2015-06-22 DIAGNOSIS — Z79899 Other long term (current) drug therapy: Secondary | ICD-10-CM | POA: Insufficient documentation

## 2015-06-22 DIAGNOSIS — Z91018 Allergy to other foods: Secondary | ICD-10-CM | POA: Insufficient documentation

## 2015-06-22 DIAGNOSIS — D259 Leiomyoma of uterus, unspecified: Secondary | ICD-10-CM | POA: Insufficient documentation

## 2015-06-22 DIAGNOSIS — K660 Peritoneal adhesions (postprocedural) (postinfection): Secondary | ICD-10-CM | POA: Diagnosis not present

## 2015-06-22 HISTORY — PX: CYSTOSCOPY: SHX5120

## 2015-06-22 LAB — PREGNANCY, URINE: Preg Test, Ur: NEGATIVE

## 2015-06-22 LAB — BASIC METABOLIC PANEL
Anion gap: 6 (ref 5–15)
BUN: 10 mg/dL (ref 6–20)
CO2: 23 mmol/L (ref 22–32)
CREATININE: 0.75 mg/dL (ref 0.44–1.00)
Calcium: 8.2 mg/dL — ABNORMAL LOW (ref 8.9–10.3)
Chloride: 107 mmol/L (ref 101–111)
GFR calc Af Amer: >60 mL/min (ref >60)
Glucose, Bld: 130 mg/dL — ABNORMAL HIGH (ref 65–99)
Potassium: 4.5 mmol/L (ref 3.5–5.1)
SODIUM: 136 mmol/L (ref 135–145)

## 2015-06-22 LAB — CBC
HCT: 36.1 % (ref 36.0–46.0)
Hemoglobin: 11.6 g/dL — ABNORMAL LOW (ref 12.0–15.0)
MCH: 28.6 pg (ref 26.0–34.0)
MCHC: 32.1 g/dL (ref 30.0–36.0)
MCV: 89.1 fL (ref 78.0–100.0)
PLATELETS: 182 10*3/uL (ref 150–400)
RBC: 4.05 MIL/uL (ref 3.87–5.11)
RDW: 14 % (ref 11.5–15.5)
WBC: 10.8 10*3/uL — ABNORMAL HIGH (ref 4.0–10.5)

## 2015-06-22 SURGERY — ROBOTIC ASSISTED TOTAL HYSTERECTOMY WITH SALPINGECTOMY
Anesthesia: General | Site: Urethra

## 2015-06-22 MED ORDER — SCOPOLAMINE 1 MG/3DAYS TD PT72
1.0000 | MEDICATED_PATCH | Freq: Once | TRANSDERMAL | Status: DC
  Administered 2015-06-22: 1.5 mg via TRANSDERMAL

## 2015-06-22 MED ORDER — GLYCOPYRROLATE 0.2 MG/ML IJ SOLN
INTRAMUSCULAR | Status: DC | PRN
  Administered 2015-06-22: 0.6 mg via INTRAVENOUS

## 2015-06-22 MED ORDER — BUPROPION HCL ER (SR) 150 MG PO TB12
150.0000 mg | ORAL_TABLET | Freq: Two times a day (BID) | ORAL | Status: DC
  Filled 2015-06-22 (×3): qty 1

## 2015-06-22 MED ORDER — FENTANYL CITRATE (PF) 100 MCG/2ML IJ SOLN
INTRAMUSCULAR | Status: AC
  Filled 2015-06-22: qty 4

## 2015-06-22 MED ORDER — IBUPROFEN 600 MG PO TABS: 600.0000 mg | ORAL_TABLET | Freq: Four times a day (QID) | ORAL | Status: DC | PRN

## 2015-06-22 MED ORDER — MIDAZOLAM HCL 2 MG/2ML IJ SOLN
1.0000 mg | Freq: Once | INTRAMUSCULAR | Status: AC
  Administered 2015-06-22: 1 mg via INTRAVENOUS

## 2015-06-22 MED ORDER — ONDANSETRON HCL 4 MG/2ML IJ SOLN
INTRAMUSCULAR | Status: DC | PRN
  Administered 2015-06-22: 4 mg via INTRAVENOUS

## 2015-06-22 MED ORDER — PROPOFOL 10 MG/ML IV BOLUS
INTRAVENOUS | Status: DC | PRN
  Administered 2015-06-22: 170 mg via INTRAVENOUS

## 2015-06-22 MED ORDER — KETOROLAC TROMETHAMINE 30 MG/ML IJ SOLN
INTRAMUSCULAR | Status: AC
  Filled 2015-06-22: qty 1

## 2015-06-22 MED ORDER — MIDAZOLAM HCL 2 MG/2ML IJ SOLN
INTRAMUSCULAR | Status: AC
  Filled 2015-06-22: qty 4

## 2015-06-22 MED ORDER — NEOSTIGMINE METHYLSULFATE 10 MG/10ML IV SOLN
INTRAVENOUS | Status: AC
  Filled 2015-06-22: qty 1

## 2015-06-22 MED ORDER — OXYCODONE-ACETAMINOPHEN 5-325 MG PO TABS
1.0000 | ORAL_TABLET | ORAL | Status: DC | PRN
  Administered 2015-06-23: 1 via ORAL
  Filled 2015-06-22: qty 1

## 2015-06-22 MED ORDER — ARTIFICIAL TEARS OP OINT
TOPICAL_OINTMENT | OPHTHALMIC | Status: AC
  Filled 2015-06-22: qty 3.5

## 2015-06-22 MED ORDER — ALPRAZOLAM 0.25 MG PO TABS: 0.2500 mg | ORAL_TABLET | Freq: Three times a day (TID) | ORAL | Status: DC | PRN

## 2015-06-22 MED ORDER — HYDROMORPHONE HCL 1 MG/ML IJ SOLN
0.2500 mg | INTRAMUSCULAR | Status: DC | PRN
  Administered 2015-06-22 (×4): 0.5 mg via INTRAVENOUS

## 2015-06-22 MED ORDER — ONDANSETRON HCL 4 MG PO TABS: 4.0000 mg | ORAL_TABLET | Freq: Four times a day (QID) | ORAL | Status: DC | PRN

## 2015-06-22 MED ORDER — ONDANSETRON HCL 4 MG/2ML IJ SOLN: 4.0000 mg | Freq: Four times a day (QID) | INTRAMUSCULAR | Status: DC | PRN

## 2015-06-22 MED ORDER — BUPIVACAINE HCL (PF) 0.25 % IJ SOLN
INTRAMUSCULAR | Status: DC | PRN
  Administered 2015-06-22: 25 mL

## 2015-06-22 MED ORDER — MIDAZOLAM HCL 2 MG/2ML IJ SOLN
INTRAMUSCULAR | Status: DC | PRN
Start: 2015-06-22 — End: 2015-06-22
  Administered 2015-06-22: 2 mg via INTRAVENOUS

## 2015-06-22 MED ORDER — LACTATED RINGERS IV SOLN
INTRAVENOUS | Status: DC
  Administered 2015-06-22: 13:00:00 via INTRAVENOUS

## 2015-06-22 MED ORDER — GABAPENTIN 400 MG PO CAPS
400.0000 mg | ORAL_CAPSULE | Freq: Once | ORAL | Status: AC
  Administered 2015-06-22: 400 mg via ORAL
  Filled 2015-06-22: qty 1

## 2015-06-22 MED ORDER — FENTANYL CITRATE (PF) 100 MCG/2ML IJ SOLN
INTRAMUSCULAR | Status: DC | PRN
  Administered 2015-06-22 (×7): 50 ug via INTRAVENOUS

## 2015-06-22 MED ORDER — DEXAMETHASONE SODIUM PHOSPHATE 4 MG/ML IJ SOLN
INTRAMUSCULAR | Status: AC
  Filled 2015-06-22: qty 1

## 2015-06-22 MED ORDER — MORPHINE SULFATE (PF) 4 MG/ML IV SOLN
2.0000 mg | INTRAVENOUS | Status: DC | PRN
  Administered 2015-06-22 – 2015-06-23 (×7): 2 mg via INTRAVENOUS
  Filled 2015-06-22 (×8): qty 1

## 2015-06-22 MED ORDER — DEXAMETHASONE SODIUM PHOSPHATE 10 MG/ML IJ SOLN
INTRAMUSCULAR | Status: DC | PRN
  Administered 2015-06-22: 4 mg via INTRAVENOUS

## 2015-06-22 MED ORDER — ROCURONIUM BROMIDE 100 MG/10ML IV SOLN
INTRAVENOUS | Status: AC
  Filled 2015-06-22: qty 1

## 2015-06-22 MED ORDER — ROCURONIUM BROMIDE 100 MG/10ML IV SOLN
INTRAVENOUS | Status: DC | PRN
  Administered 2015-06-22: 15 mg via INTRAVENOUS
  Administered 2015-06-22: 30 mg via INTRAVENOUS
  Administered 2015-06-22 (×2): 10 mg via INTRAVENOUS

## 2015-06-22 MED ORDER — MENTHOL 3 MG MT LOZG: 1.0000 | LOZENGE | OROMUCOSAL | Status: DC | PRN

## 2015-06-22 MED ORDER — EPHEDRINE 5 MG/ML INJ
INTRAVENOUS | Status: AC
  Filled 2015-06-22: qty 10

## 2015-06-22 MED ORDER — ROPIVACAINE HCL 5 MG/ML IJ SOLN
INTRAMUSCULAR | Status: AC
  Filled 2015-06-22: qty 30

## 2015-06-22 MED ORDER — ONDANSETRON HCL 4 MG/2ML IJ SOLN
INTRAMUSCULAR | Status: AC
  Filled 2015-06-22: qty 2

## 2015-06-22 MED ORDER — PROMETHAZINE HCL 25 MG/ML IJ SOLN: 6.2500 mg | INTRAMUSCULAR | Status: DC | PRN

## 2015-06-22 MED ORDER — NEOSTIGMINE METHYLSULFATE 10 MG/10ML IV SOLN
INTRAVENOUS | Status: DC | PRN
  Administered 2015-06-22: 4 mg via INTRAVENOUS

## 2015-06-22 MED ORDER — FENTANYL CITRATE (PF) 250 MCG/5ML IJ SOLN
INTRAMUSCULAR | Status: AC
  Filled 2015-06-22: qty 25

## 2015-06-22 MED ORDER — KETOROLAC TROMETHAMINE 30 MG/ML IJ SOLN
INTRAMUSCULAR | Status: DC | PRN
  Administered 2015-06-22: 30 mg via INTRAVENOUS

## 2015-06-22 MED ORDER — MIDAZOLAM HCL 2 MG/2ML IJ SOLN
INTRAMUSCULAR | Status: AC
  Filled 2015-06-22: qty 2

## 2015-06-22 MED ORDER — LIDOCAINE HCL (CARDIAC) 20 MG/ML IV SOLN
INTRAVENOUS | Status: DC | PRN
  Administered 2015-06-22: 70 mg via INTRAVENOUS
  Administered 2015-06-22: 30 mg via INTRAVENOUS

## 2015-06-22 MED ORDER — GLYCOPYRROLATE 0.2 MG/ML IJ SOLN
INTRAMUSCULAR | Status: AC
  Filled 2015-06-22: qty 3

## 2015-06-22 MED ORDER — SODIUM CHLORIDE 0.9 % IJ SOLN
INTRAMUSCULAR | Status: AC
  Filled 2015-06-22: qty 50

## 2015-06-22 MED ORDER — PROPOFOL 10 MG/ML IV BOLUS
INTRAVENOUS | Status: AC
  Filled 2015-06-22: qty 20

## 2015-06-22 MED ORDER — MEPERIDINE HCL 25 MG/ML IJ SOLN
6.2500 mg | INTRAMUSCULAR | Status: DC | PRN
  Administered 2015-06-22 (×2): 6.25 mg via INTRAVENOUS

## 2015-06-22 MED ORDER — HYDROMORPHONE HCL 1 MG/ML IJ SOLN
INTRAMUSCULAR | Status: AC
  Administered 2015-06-22: 0.5 mg via INTRAVENOUS
  Filled 2015-06-22: qty 1

## 2015-06-22 MED ORDER — GABAPENTIN 800 MG PO TABS
400.0000 mg | ORAL_TABLET | Freq: Once | ORAL | Status: DC
  Filled 2015-06-22: qty 0.5

## 2015-06-22 MED ORDER — KETOROLAC TROMETHAMINE 30 MG/ML IJ SOLN: 30.0000 mg | Freq: Once | INTRAMUSCULAR | Status: DC | PRN

## 2015-06-22 MED ORDER — ACETAMINOPHEN 10 MG/ML IV SOLN
1000.0000 mg | Freq: Once | INTRAVENOUS | Status: AC
  Administered 2015-06-22: 1000 mg via INTRAVENOUS
  Filled 2015-06-22: qty 100

## 2015-06-22 MED ORDER — KETOROLAC TROMETHAMINE 30 MG/ML IJ SOLN
30.0000 mg | Freq: Four times a day (QID) | INTRAMUSCULAR | Status: DC
  Administered 2015-06-22 – 2015-06-23 (×3): 30 mg via INTRAVENOUS
  Filled 2015-06-22 (×3): qty 1

## 2015-06-22 MED ORDER — EPHEDRINE SULFATE 50 MG/ML IJ SOLN
INTRAMUSCULAR | Status: DC | PRN
  Administered 2015-06-22: 15 mg via INTRAVENOUS

## 2015-06-22 MED ORDER — BUPIVACAINE HCL (PF) 0.25 % IJ SOLN
INTRAMUSCULAR | Status: AC
  Filled 2015-06-22: qty 30

## 2015-06-22 MED ORDER — LACTATED RINGERS IV SOLN
INTRAVENOUS | Status: DC
  Administered 2015-06-22 (×2): via INTRAVENOUS

## 2015-06-22 MED ORDER — LIDOCAINE HCL (CARDIAC) 20 MG/ML IV SOLN
INTRAVENOUS | Status: AC
  Filled 2015-06-22: qty 5

## 2015-06-22 MED ORDER — PHENYLEPHRINE 40 MCG/ML (10ML) SYRINGE FOR IV PUSH (FOR BLOOD PRESSURE SUPPORT)
PREFILLED_SYRINGE | INTRAVENOUS | Status: AC
  Filled 2015-06-22: qty 10

## 2015-06-22 MED ORDER — GLYCOPYRROLATE 0.2 MG/ML IJ SOLN
INTRAMUSCULAR | Status: AC
  Filled 2015-06-22: qty 1

## 2015-06-22 MED ORDER — MEPERIDINE HCL 25 MG/ML IJ SOLN
INTRAMUSCULAR | Status: AC
  Filled 2015-06-22: qty 1

## 2015-06-22 MED ORDER — SCOPOLAMINE 1 MG/3DAYS TD PT72
MEDICATED_PATCH | TRANSDERMAL | Status: AC
  Filled 2015-06-22: qty 1

## 2015-06-22 MED ORDER — SODIUM CHLORIDE 0.9 % IV SOLN
INTRAVENOUS | Status: DC | PRN
  Administered 2015-06-22: 08:00:00

## 2015-06-22 MED ORDER — LACTATED RINGERS IR SOLN
Status: DC | PRN
  Administered 2015-06-22: 3000 mL

## 2015-06-22 MED ORDER — LACTATED RINGERS IV SOLN
INTRAVENOUS | Status: DC
  Administered 2015-06-22 – 2015-06-23 (×2): via INTRAVENOUS

## 2015-06-22 SURGICAL SUPPLY — 85 items
BARRIER ADHS 3X4 INTERCEED (GAUZE/BANDAGES/DRESSINGS) IMPLANT
CANISTER SUCT 3000ML (MISCELLANEOUS) ×4 IMPLANT
CATH FOLEY 3WAY  5CC 16FR (CATHETERS) ×1
CATH FOLEY 3WAY 5CC 16FR (CATHETERS) ×3 IMPLANT
CELLS DAT CNTRL 66122 CELL SVR (MISCELLANEOUS) IMPLANT
CLOTH BEACON ORANGE TIMEOUT ST (SAFETY) ×4 IMPLANT
CONT PATH 16OZ SNAP LID 3702 (MISCELLANEOUS) ×4 IMPLANT
COVER BACK TABLE 60X90IN (DRAPES) ×8 IMPLANT
COVER TIP SHEARS 8 DVNC (MISCELLANEOUS) ×6 IMPLANT
COVER TIP SHEARS 8MM DA VINCI (MISCELLANEOUS) ×2
DECANTER SPIKE VIAL GLASS SM (MISCELLANEOUS) ×12 IMPLANT
DRAPE ROBOTICS STRL (DRAPES) IMPLANT
DRAPE WARM FLUID 44X44 (DRAPE) IMPLANT
DRSG OPSITE POSTOP 3X4 (GAUZE/BANDAGES/DRESSINGS) ×4 IMPLANT
DRSG OPSITE POSTOP 4X10 (GAUZE/BANDAGES/DRESSINGS) ×4 IMPLANT
DURAPREP 26ML APPLICATOR (WOUND CARE) ×4 IMPLANT
ELECT REM PT RETURN 9FT ADLT (ELECTROSURGICAL) ×4
ELECTRODE REM PT RTRN 9FT ADLT (ELECTROSURGICAL) ×3 IMPLANT
GAUZE SPONGE 4X4 16PLY XRAY LF (GAUZE/BANDAGES/DRESSINGS) IMPLANT
GAUZE VASELINE 3X9 (GAUZE/BANDAGES/DRESSINGS) ×4 IMPLANT
GLOVE BIO SURGEON STRL SZ 6.5 (GLOVE) ×12 IMPLANT
GLOVE BIOGEL PI IND STRL 6.5 (GLOVE) ×3 IMPLANT
GLOVE BIOGEL PI IND STRL 7.0 (GLOVE) ×24 IMPLANT
GLOVE BIOGEL PI INDICATOR 6.5 (GLOVE) ×1
GLOVE BIOGEL PI INDICATOR 7.0 (GLOVE) ×8
GOWN STRL REUS W/TWL LRG LVL3 (GOWN DISPOSABLE) ×12 IMPLANT
GYRUS RUMI II 2.5CM BLUE (DISPOSABLE) ×4
KIT ACCESSORY DA VINCI DISP (KITS) ×1
KIT ACCESSORY DVNC DISP (KITS) ×3 IMPLANT
LEGGING LITHOTOMY PAIR STRL (DRAPES) ×4 IMPLANT
LIQUID BAND (GAUZE/BANDAGES/DRESSINGS) ×4 IMPLANT
NEEDLE HYPO 22GX1.5 SAFETY (NEEDLE) IMPLANT
NEEDLE INSUFFLATION 120MM (ENDOMECHANICALS) ×4 IMPLANT
NS IRRIG 1000ML POUR BTL (IV SOLUTION) ×4 IMPLANT
OCCLUDER COLPOPNEUMO (BALLOONS) ×4 IMPLANT
PACK ABDOMINAL GYN (CUSTOM PROCEDURE TRAY) IMPLANT
PACK ROBOT WH (CUSTOM PROCEDURE TRAY) ×4 IMPLANT
PACK ROBOTIC GOWN (GOWN DISPOSABLE) ×4 IMPLANT
PACK VAGINAL MINOR WOMEN LF (CUSTOM PROCEDURE TRAY) IMPLANT
PAD OB MATERNITY 4.3X12.25 (PERSONAL CARE ITEMS) ×4 IMPLANT
PAD POSITIONING PINK XL (MISCELLANEOUS) ×4 IMPLANT
PAD PREP 24X48 CUFFED NSTRL (MISCELLANEOUS) ×8 IMPLANT
PROTECTOR NERVE ULNAR (MISCELLANEOUS) ×4 IMPLANT
RETRACTOR WND ALEXIS 25 LRG (MISCELLANEOUS) IMPLANT
RTRCTR WOUND ALEXIS 18CM MED (MISCELLANEOUS)
RTRCTR WOUND ALEXIS 25CM LRG (MISCELLANEOUS)
RUMI II GYRUS 2.5CM BLUE (DISPOSABLE) ×3 IMPLANT
SET BI-LUMEN FLTR TB AIRSEAL (TUBING) ×4 IMPLANT
SET CYSTO W/LG BORE CLAMP LF (SET/KITS/TRAYS/PACK) ×4 IMPLANT
SET IRRIG TUBING LAPAROSCOPIC (IRRIGATION / IRRIGATOR) ×4 IMPLANT
SHEET LAVH (DRAPES) IMPLANT
SPONGE LAP 18X18 X RAY DECT (DISPOSABLE) IMPLANT
STAPLER VISISTAT 35W (STAPLE) IMPLANT
SUT PLAIN 2 0 XLH (SUTURE) IMPLANT
SUT VIC AB 0 CT1 18XCR BRD8 (SUTURE) ×3 IMPLANT
SUT VIC AB 0 CT1 27 (SUTURE) ×3
SUT VIC AB 0 CT1 27XBRD ANBCTR (SUTURE) ×9 IMPLANT
SUT VIC AB 0 CT1 8-18 (SUTURE) ×1
SUT VIC AB 0 CT2 27 (SUTURE) ×8 IMPLANT
SUT VIC AB 2-0 CT1 27 (SUTURE) ×1
SUT VIC AB 2-0 CT1 TAPERPNT 27 (SUTURE) ×3 IMPLANT
SUT VIC AB 4-0 PS2 27 (SUTURE) ×4 IMPLANT
SUT VICRYL 0 TIES 12 18 (SUTURE) IMPLANT
SUT VICRYL 0 UR6 27IN ABS (SUTURE) ×8 IMPLANT
SUT VLOC 180 0 6IN GS21 (SUTURE) ×4 IMPLANT
SUT VLOC 180 0 9IN  GS21 (SUTURE)
SUT VLOC 180 0 9IN GS21 (SUTURE) IMPLANT
SYR 50ML LL SCALE MARK (SYRINGE) ×8 IMPLANT
SYR CONTROL 10ML LL (SYRINGE) IMPLANT
SYSTEM CONVERTIBLE TROCAR (TROCAR) IMPLANT
TIP RUMI ORANGE 6.7MMX12CM (TIP) IMPLANT
TIP UTERINE 5.1X6CM LAV DISP (MISCELLANEOUS) IMPLANT
TIP UTERINE 6.7X10CM GRN DISP (MISCELLANEOUS) IMPLANT
TIP UTERINE 6.7X6CM WHT DISP (MISCELLANEOUS) ×4 IMPLANT
TIP UTERINE 6.7X8CM BLUE DISP (MISCELLANEOUS) ×4 IMPLANT
TOWEL OR 17X24 6PK STRL BLUE (TOWEL DISPOSABLE) ×12 IMPLANT
TRAY FOLEY BAG SILVER LF 16FR (SET/KITS/TRAYS/PACK) ×4 IMPLANT
TRAY FOLEY CATH SILVER 14FR (SET/KITS/TRAYS/PACK) IMPLANT
TROCAR DILATING TIP 12MM 150MM (ENDOMECHANICALS) ×4 IMPLANT
TROCAR DISP BLADELESS 8 DVNC (TROCAR) ×3 IMPLANT
TROCAR DISP BLADELESS 8MM (TROCAR) ×1
TROCAR XCEL NON-BLD 5MMX100MML (ENDOMECHANICALS) ×4 IMPLANT
TUBING INSUFFLATION W/FILTER (TUBING) IMPLANT
WARMER LAPAROSCOPE (MISCELLANEOUS) ×4 IMPLANT
WATER STERILE IRR 1000ML POUR (IV SOLUTION) ×12 IMPLANT

## 2015-06-22 NOTE — Progress Notes (Signed)
Update to History and Physical  No marked change in status since office pre-op visit.   Patient examined.   OK to proceed with surgery. 

## 2015-06-22 NOTE — H&P (Signed)
Nunzio Cobbs, MD at 06/11/2015 12:51 PM     Status: Signed       Expand All Collapse All   Patient ID: Tara Velazquez, female DOB: 10-Jun-1981, 34 y.o. MRN: [id number] GYNECOLOGY VISIT  HPI: 34 y.o. Married Caucasian female  [phone] with Patient's last menstrual period was 06/06/2015 (exact date).  here for sugical consult.  Scheduled for robotic hysterectomy for CIN III.  Status post cold knife conization for possible adenocarcinoma noted at the time of colposcopy.  Final pathology on conization showed CIN II on cervical cone and CIN II and III on endocervical curettage. Pap originally was normal with positive HR HPV.   Hx Cesarean Section x 3.   Declines future childbearing.   Using Xanax every morning this week.  Has only 2 more.  Would like a few more.   GYNECOLOGIC HISTORY: Patient's last menstrual period was 06/06/2015 (exact date). Contraception:Tubal Menopausal hormone therapy: n/a Last mammogram: n/a Last pap smear: 03-08-15 Neg:Pos HR HPV;Pos #16 genotype;colposcopy 03-31-15 ECC at least HGSIL, cannot exclude Adenocarcinoma in situ. Conization of Cervix 04-27-15 showing CIN II of cervix and CIN II/III of ECC.   OB History    Gravida Para Term Preterm AB TAB SAB Ectopic Multiple Living   3 3 3       3        There are no active problems to display for this patient.   Past Medical History  Diagnosis Date  . Depression   . Nystagmus     her whole life, sees [facility]  . Anemia     d/t heavy menstrual cycles  . Anxiety   . Dysmenorrhea   . Abnormal Pap smear of cervix 03-08-15    --normal pap:Pos HR HPV w/#16 Pos genotype--colposcopy ECC at least high grade dysplasia;cannot exclude adenocarcinoma insitu    Past Surgical History  Procedure Laterality Date  . Cesarean section      2009, 2006, and 2004  . Wisdom tooth extraction    . Cervical  conization w/bx N/A 04/27/2015    Procedure: CONIZATION CERVIX WITH BIOPSY with ECC; Surgeon: Nunzio Cobbs, MD; Location: [facility]; Service: Gynecology; Laterality: N/A;    Current Outpatient Prescriptions  Medication Sig Dispense Refill  . ALPRAZolam (XANAX) 0.25 MG tablet Take one tablet by mouth up to three times a day as needed for anxiety. (Patient taking differently: Take 0.25 mg by mouth 3 (three) times daily as needed for anxiety. Take one tablet by mouth up to three times a day as needed for anxiety.) 30 tablet 0  . buPROPion (WELLBUTRIN SR) 150 MG 12 hr tablet TAKE ONE TABLET BY MOUTH TWICE DAILY 60 tablet 0  . ferrous sulfate 325 (65 FE) MG tablet Take 325 mg by mouth daily with breakfast.    . ibuprofen (ADVIL,MOTRIN) 600 MG tablet Take 1 tablet (600 mg total) by mouth every 6 (six) hours as needed for cramping. 30 tablet 5  . oxyCODONE-acetaminophen (PERCOCET) 5-325 MG per tablet Take 1 tablet by mouth every 6 (six) hours as needed. use only as much as needed to relieve pain (Patient not taking: Reported on 06/11/2015) 15 tablet 0   No current facility-administered medications for this visit.     ALLERGIES: Amoxicillin and Coconut flavor  Family History  Problem Relation Age of Onset  . Colon cancer Father   . Cancer Brother     had hysterectomy at age 35  . Diabetes Maternal Grandmother   .  Diabetes Paternal Grandfather   . Cancer Sister 35    Dec from MVA--but had hysterectomy age 76 ?[place] (pt. states mom told her -- sister dx'd with germ induced ovarian ca)  . Hyperlipidemia Mother   . Stroke Maternal Grandfather     Social History   Social History  . Marital Status: Married    Spouse Name: N/A  . Number of Children: N/A  . Years of Education: N/A   Occupational History  . Not on file.   Social History Main Topics  . Smoking status: Current  Every Day Smoker -- 1.00 packs/day    Types: Cigarettes  . Smokeless tobacco: Never Used  . Alcohol Use: 1.8 oz/week    3 Standard drinks or equivalent per week     Comment: once every couple of weeks   . Drug Use: No  . Sexual Activity:    Partners: Male    Birth Control/ Protection: Surgical     Comment: Tubal   Other Topics Concern  . Not on file   Social History Narrative   Work or School: homemaker      Home Situation: lives with husband and 3 children      Spiritual Beliefs: none      Lifestyle: walks/jogs 2 times per week/ diet is poor          ROS: Pertinent items are noted in HPI.  PHYSICAL EXAMINATION:   BP 128/76 mmHg  Pulse 80  Ht 5' 5.75" (1.67 m)  Wt 189 lb (85.73 kg)  BMI 30.74 kg/m2  LMP 06/06/2015 (Exact Date)  General appearance: alert, cooperative and appears stated age Head: Normocephalic, without obvious abnormality, atraumatic Neck: no adenopathy, supple, symmetrical, trachea midline and thyroid normal to inspection and palpation Lungs: clear to auscultation bilaterally Heart: regular rate and rhythm Abdomen: Pfannenstiel incision, soft, non-tender; bowel sounds normal; no masses, no organomegaly Extremities: extremities normal, atraumatic, no cyanosis or edema Skin: Skin color, texture, turgor normal. No rashes or lesions Lymph nodes: Cervical, supraclavicular, and axillary nodes normal. No abnormal inguinal nodes palpated Neurologic: Grossly normal  Pelvic: External genitalia: no lesions  Urethra: normal appearing urethra with no masses, tenderness or lesions  Bartholins and Skenes: normal   Vagina: normal appearing vagina with normal color and discharge, no lesions  Cervix: consistent wiht LEEP. Menstrual flow - light.. No decensus of uterus or cervix.    Bimanual Exam: Uterus: normal size, contour,  position, consistency, mobility, non-tender  Adnexa: normal adnexa and no mass, fullness, tenderness   Chaperone was present for exam.  ASSESSMENT  CIN III.  Status post cold knife conization with ECC showing CIN III. Status post Cesarean Section x 3.  Anxiety.   PLAN  Proceed with robotic total laparoscopic hysterectomy, bilateral salpingectomy, and cystoscopy.  Risks, benefits and alternatives discussed with patient who wishes to proceed.  Discussed again the possibility of laparotomy.  Discussed again the possibility of continued abnormal paps and persistent HPV.  Rx for Xanax. #15, RF zero.   An After Visit Summary was printed and given to the patient.  __25____ minutes face to face time of which over 50% was spent in counseling.

## 2015-06-22 NOTE — Anesthesia Postprocedure Evaluation (Signed)
Anesthesia Post Note  Patient: Tara Velazquez  Procedure(s) Performed: Procedure(s) (LRB): ROBOTIC ASSISTED TOTAL HYSTERECTOMY WITH BILATERAL SALPINGECTOMY,  (Bilateral) CYSTOSCOPY (N/A)  Anesthesia type: General  Patient location: PACU  Post pain: Pain level controlled  Post assessment: Post-op Vital signs reviewed  Last Vitals:  Filed Vitals:   06/22/15 1145  BP:   Pulse: 97  Temp:   Resp: 22    Post vital signs: Reviewed  Level of consciousness: sedated  Complications: No apparent anesthesia complications

## 2015-06-22 NOTE — Transfer of Care (Signed)
Immediate Anesthesia Transfer of Care Note  Patient: Tara Velazquez  Procedure(s) Performed: Procedure(s): ROBOTIC ASSISTED TOTAL HYSTERECTOMY WITH BILATERAL SALPINGECTOMY,  (Bilateral) CYSTOSCOPY (N/A)  Patient Location: PACU  Anesthesia Type:General  Level of Consciousness: awake, alert , oriented and patient cooperative  Airway & Oxygen Therapy: Patient Spontanous Breathing and Patient connected to nasal cannula oxygen  Post-op Assessment: Report given to RN and Post -op Vital signs reviewed and stable  Post vital signs: Reviewed and stable  Last Vitals:  Filed Vitals:   06/22/15 0612  BP: 118/79  Pulse: 87  Temp: 36.7 C  Resp: 20    Complications: No apparent anesthesia complications

## 2015-06-22 NOTE — Progress Notes (Signed)
Day of Surgery Procedure(s) (LRB): ROBOTIC ASSISTED TOTAL HYSTERECTOMY WITH BILATERAL SALPINGECTOMY,  (Bilateral) CYSTOSCOPY (N/A)  Subjective: Patient reports some pain which is controlled by the morphine and Toradol. Hungry.  Not out of bed yet.  Wants to start moving.  Foley still in.  Objective: I have reviewed patient's vital signs, intake and output and labs. T 98, BP 109/68, P 86, RR 16. I/O - 2025 cc/425 cc. Hgb 11.6, WBC 10.8, Glucose 130, Ca 8.2.  General: alert and cooperative Resp: clear to auscultation bilaterally Cardio: regular rate and rhythm, S1, S2 normal, no murmur, click, rub or gallop GI: soft, non-tender; bowel sounds normal; no masses,  no organomegaly and incision: clean, dry and intact Extremities: PAS and Ted hose on. DPs 2+ bilaterally.  Vaginal Bleeding: none  Assessment: s/p Procedure(s): ROBOTIC ASSISTED TOTAL HYSTERECTOMY WITH BILATERAL SALPINGECTOMY,  (Bilateral) CYSTOSCOPY (N/A): stable  Plan: Advance diet.  Discontinue foley.  Ambulate.  Hospitalization overnight with discharge tomorrow. Surgical findings and procedure reviewed.      Tara Velazquez 06/22/2015, 4:07 PM

## 2015-06-22 NOTE — Anesthesia Preprocedure Evaluation (Signed)
Anesthesia Evaluation  Patient identified by MRN, date of birth, ID band Patient awake    Reviewed: Allergy & Precautions, H&P , NPO status , Patient's Chart, lab work & pertinent test results  Airway Mallampati: I  TM Distance: >3 FB Neck ROM: full    Dental no notable dental hx. (+) Teeth Intact   Pulmonary Current Smoker,    Pulmonary exam normal        Cardiovascular negative cardio ROS Normal cardiovascular exam     Neuro/Psych negative neurological ROS     GI/Hepatic negative GI ROS, Neg liver ROS,   Endo/Other  negative endocrine ROS  Renal/GU negative Renal ROS     Musculoskeletal   Abdominal (+) + obese,   Peds  Hematology   Anesthesia Other Findings   Reproductive/Obstetrics negative OB ROS                             Anesthesia Physical Anesthesia Plan  ASA: II  Anesthesia Plan: General   Post-op Pain Management:    Induction: Intravenous  Airway Management Planned: Oral ETT  Additional Equipment:   Intra-op Plan:   Post-operative Plan: Extubation in OR  Informed Consent: I have reviewed the patients History and Physical, chart, labs and discussed the procedure including the risks, benefits and alternatives for the proposed anesthesia with the patient or authorized representative who has indicated his/her understanding and acceptance.   Dental Advisory Given  Plan Discussed with: CRNA  Anesthesia Plan Comments:         Anesthesia Quick Evaluation

## 2015-06-22 NOTE — Anesthesia Procedure Notes (Signed)
Date/Time: 06/22/2015 7:27 AM Performed by: Tobin Chad Pre-anesthesia Checklist: Patient identified, Timeout performed, Emergency Drugs available, Suction available and Patient being monitored Patient Re-evaluated:Patient Re-evaluated prior to inductionOxygen Delivery Method: Circle system utilized and Simple face mask Preoxygenation: Pre-oxygenation with 100% oxygen Intubation Type: IV induction Ventilation: Mask ventilation without difficulty Laryngoscope Size: Mac and 3 Grade View: Grade II Tube type: Oral Tube size: 7.0 mm Number of attempts: 1 Airway Equipment and Method: Stylet Placement Confirmation: ETT inserted through vocal cords under direct vision,  breath sounds checked- equal and bilateral and positive ETCO2 Secured at: 22 cm Tube secured with: Tape Dental Injury: Teeth and Oropharynx as per pre-operative assessment

## 2015-06-22 NOTE — Brief Op Note (Signed)
06/22/2015  10:39 AM  PATIENT:  Tara Velazquez  34 y.o. female  PRE-OPERATIVE DIAGNOSIS:  CIN3  POST-OPERATIVE DIAGNOSIS:  CIN3  PROCEDURE:  Procedure(s): ROBOTIC ASSISTED TOTAL HYSTERECTOMY WITH BILATERAL SALPINGECTOMY,  (Bilateral) CYSTOSCOPY (N/A)  SURGEON:  Surgeon(s) and Role:    * Reginna Sermeno E Yisroel Ramming, MD - Primary    * Megan Salon, MD - Assisting  PHYSICIAN ASSISTANT: NA  ASSISTANTS:   Megan Salon, MD - Assisting  ANESTHESIA:   local, general and intraperitoneal Ropivicaine  EBL:  Total I/O In: 1400 [I.V.:1400] Out: 300 [Urine:250; Blood:50]  BLOOD ADMINISTERED:none  DRAINS: Urinary Catheter (Foley)   LOCAL MEDICATIONS USED:  MARCAINE     SPECIMEN:  Source of Specimen:  Uterus, cervix, bilateral fallopian tubes.  DISPOSITION OF SPECIMEN:  PATHOLOGY  COUNTS:  YES  TOURNIQUET:  * No tourniquets in log *  DICTATION: .Note written in [place]: Admit for overnight observation  PATIENT DISPOSITION:  PACU - hemodynamically stable.   Delay start of Pharmacological VTE agent (>24hrs) due to surgical blood loss or risk of bleeding: not applicable

## 2015-06-22 NOTE — Op Note (Signed)
OPERATIVE REPORT   PREOPERATIVE DIAGNOSIS:   CIN III.  POSTOPERATIVE DIAGNOSIS:   CIN III.  PROCEDURES: Da Vinci robotic total laparoscopic hysterectomy with bilateral salpingectomy, cystoscopy, lysis of omental adhesions.   SURGEON: Lenard Galloway, M.D.  ASSISTANT: Lyman Speller, M.D.  ANESTHESIA: General endotracheal, intraperitoneal ropivicaine   diluted in normal saline, local with 0.25% Marcaine.  IV FLUIDS:  1400 cc LR.  ESTIMATED BLOOD LOSS:  50 cc.  URINE OUTPUT: 250 cc.  COMPLICATIONS: None.  INDICATIONS FOR THE PROCEDURE:    The patient is a 34 year old para 84 Caucasian female, status post bilateral tubal ligation, who had a cold knife conization showing CIN II on the cone specimen and CIN III on the accompanying ECC.  A plan is made to proceed with a robotic total laparoscopic hysterectomy with bilateral salpingectomy and cystoscopy after risks, benefits, and alternatives are reviewed.  FINDINGS:    The uterus and ovaries were unremarkable.  The cervix had an appearance consistent with prior conization. No gross lesions were seen. The bilateral fallopian tubes were consistent with prior tubal ligation.  There were omental adhesions adherent to the anterior abdominal wall on the underside of the Pfannenstiel incision.  The liver and gall bladder were unremarkable.  The posterior cul de sac showed a Master's window.  There were no obvious lesions of endometriosis.   Cystoscopy at the termination of the procedure showed the bladder to be normal throughout 360 degrees including the bladder dome and trigone. There was no evidence of any foreign body in the bladder or the urethra. There was no evidence of any lesions  of the bladder or the urethra.  Both of the ureters were noted to be patent bilaterally.  SPECIMENS:    The uterus, cervix, and bilateral tubes were went to pathology.   DESCRIPTION OF PROCEDURE:   The patient was reidentified in the  preoperative hold area.  She did receive Ciprofloxacin and Clindamycin IV for antibiotic prophylaxis. She received TED hose and PAS stockings for DVT prophylaxis.  In the operating room, the patient was placed in the dorsal lithotomy position on the operating room table. The anti-slip pad was used under the patient. Her legs were placed in the Marquette stirrups and her arms were both tucked at her sides with the appropriate padded arm protectors. The patient received general endotracheal anesthesia and she then received proper padding around her head and neck.  The abdomen and vagina were then sterilely prepped and she was sterilely draped.  A speculum was placed in the vagina and a single-tooth tenaculum was placed on the anterior cervical lip. A figure-of-eight suture of 0 Vicryl was placed on each the anterior and the posterior cervical lips. The uterus was sounded to 9 cm. The cervix was then dilated with [facility] dilators. A #  8 RUMI tip with a KOH ring was then placed through the cervix and into the uterine cavity without difficulty. The remaining vaginal instruments were then removed. A Foley catheter was placed inside the bladder.  Attention was turned to the abdomen where the infraumbilical region was injected with 0.25% Marcaine and a small incision created.  A Veress needle was then used to insufflate the abdomen with CO@ gas after a saline drop test was performed and the fluid flowed freely.  A 12 mm supraumbilical incision was created with a scalpel after the skin was injected locally with 0.25% Marcaine. A 12 mm robotic camera port was then placed through this umbilical incision. 8  mm incisions were then created to the right and left of the supraumbilical incision after the skin was injected locally with 0.25% Marcaine.  The 8 mm trocars were then placed under visualization of the laparoscope.  A 5 mm assistant port was placed through a 5 mm incision in the right lower quadrant after  local injection with 0.25% Marcaine.   Ropivicine was placed inside the peritoneal cavity.   At this time, the patient was placed in Trendelenburg position. An inspection of the abdomen and pelvis was performed. The findings are as noted above.   The robot was docked on the patient's left-hand side. The monopolar laparoscopic scissors was placed in robotic port #1 and the PK instrument was placed through robotic port site #2.  At this time, I stepped to the surgeon's console while my assistant and the rest of the surgical team remained at the patient's side.  The omental adhesions to the anterior abdominal wall were lysed using the PK instrument and the monopolar scissors for good hemostasis.   There was no bowel in the region of the adhesions.   Next, the left ureter was identified. The distal left fallopian tube was grasped and a combination of bipolar cautery and the monopolar scissors were used to dissect the fallopian tube away from the mesosalpinx and the left ovary.  The specimen was removed in pieces and set aside for pathologic specimen. The left utero-ovarian ligament was cauteriezed with the PK and sharply divided. The left round ligament was then similarly cauterized with the PK instrument and sharply divided with the laparoscopic scissors. Dissection was performed to the anterior and posterior leaves of the broad ligaments again using the monopolar laparoscopic scissors. The incision was carried across the anterior cul- de-sac along the vesicouterine fold and the bladder was dissected away from the cervix using monopolar cautery and the laparoscopic scissors. The peritoneum was taken down posteriorly. The left uterine artery was skeletonized at this time using sharp dissection and monopolar cautery.   It was then cauterized with the PK instrument and was then cut using the laparoscopic scissors.    Attention was turned to the patient's right-hand side at this time. Along the right  pelvic sidewall, the ureter was identified.The distal right fallopian tube was grasped and a combination of bipolar cautery and the monopolar scissors were used to dissect the fallopian tube away from the mesosalpinx and the right ovary.  The right fallopian tube specimen was removed in pieces and set aside for pathologic specimen. The right utero-ovarian ligament was cauteriezed with the PK and sharply divided.  The right round ligament was then cauterized with the PK instrument and sharply divided with the laparoscopic monopolar scissors. Again, the anterior and posterior leaves of the broad ligament were taken down using monopolar laparoscopic scissors for dissection. The bladder flap was taken down anteriorly using the monopolar scissors. The uterine artery was skeletonized along the patient's right hand side and was then cauterized with the PK instrument and was divided with the laparoscopic scissors.    The KOH ring was nicely visible. The colpotomy incision was performed with the laparoscopic monopolar scissors in a circumferential fashion. The specimen was then removed vaginally from the peritoneal cavity and was sent to Pathology. The balloon occluder was placed in the vagina. The vaginal cuff was sutured at this time using a running suture of 0 V-Loc. The vagina was closed from the patient's right hand side to the left hand side and then back 2 sutures towards  the midline. This provided good full-thickness closure of the vaginal cuff.  The pelvis was irrigated and suctioned.  There was good hemostasis of the operative sites and pedicles.  The laparoscopic needle for suturing was parked in the anterior peritoneum.  Final inspection of all operative sites demonstrated good hemostasis. The robotic laparoscopic instruments were removed.  The robot was undocked.  A 8-mm laparoscope was placed at this time and the needle was retrieved from inside the peritoneal cavity. The laparoscopic  trocars were removed under visualization of the laparoscope. The CO2 pneumoperitoneum was released. The patient received manual breaths to remove any remaining CO2 gas and then the umbilical trocar was removed.  The patient's 3-way Foley catheter was removed at this time and cystoscopy was performed and the findings are as noted above. The Foley catheter was then placed inside the bladder and left to gravity drainage.  Final inspection of the vagina demonstrated good hemostasis of the vaginal cuff.  The abdominal incisions were closed. The umbilical fascia was closed with a figure-of-eight suture of 0 Vicryl. All trocar incisions were closed with subcuticular sutures of 4-0 Vicryl. Dermabond was placed over all the incisions and the umbilical incision was closed with a honeycomb dressing.  This concluded the patient's procedure. She was extubated and escorted to the recovery room in stable and awake condition. There were no complications to the procedure. All needle, instrument, and sponge counts were correct.     Lenard Galloway, M.D.

## 2015-06-23 ENCOUNTER — Encounter (HOSPITAL_COMMUNITY): Payer: Self-pay | Admitting: Obstetrics and Gynecology

## 2015-06-23 DIAGNOSIS — D069 Carcinoma in situ of cervix, unspecified: Secondary | ICD-10-CM | POA: Diagnosis not present

## 2015-06-23 LAB — CBC WITH DIFFERENTIAL/PLATELET
Basophils Absolute: 0 10*3/uL (ref 0.0–0.1)
Basophils Relative: 0 %
EOS ABS: 0 10*3/uL (ref 0.0–0.7)
Eosinophils Relative: 1 %
HEMATOCRIT: 33.1 % — AB (ref 36.0–46.0)
HEMOGLOBIN: 10.5 g/dL — AB (ref 12.0–15.0)
LYMPHS ABS: 1.4 10*3/uL (ref 0.7–4.0)
Lymphocytes Relative: 18 %
MCH: 28.6 pg (ref 26.0–34.0)
MCHC: 31.7 g/dL (ref 30.0–36.0)
MCV: 90.2 fL (ref 78.0–100.0)
MONOS PCT: 8 %
Monocytes Absolute: 0.6 10*3/uL (ref 0.1–1.0)
NEUTROS PCT: 73 %
Neutro Abs: 5.4 10*3/uL (ref 1.7–7.7)
Platelets: 162 10*3/uL (ref 150–400)
RBC: 3.67 MIL/uL — AB (ref 3.87–5.11)
RDW: 14.3 % (ref 11.5–15.5)
WBC: 7.5 10*3/uL (ref 4.0–10.5)

## 2015-06-23 MED ORDER — IBUPROFEN 600 MG PO TABS: 600.0000 mg | ORAL_TABLET | Freq: Four times a day (QID) | ORAL | Status: DC | PRN

## 2015-06-23 MED ORDER — OXYCODONE-ACETAMINOPHEN 5-325 MG PO TABS: 1.0000 | ORAL_TABLET | ORAL | Status: DC | PRN

## 2015-06-23 NOTE — Anesthesia Postprocedure Evaluation (Signed)
  Anesthesia Post-op Note  Patient: Tara Velazquez  Procedure(s) Performed: Procedure(s): ROBOTIC ASSISTED TOTAL HYSTERECTOMY WITH BILATERAL SALPINGECTOMY,  (Bilateral) CYSTOSCOPY (N/A)  Patient Location: [facility]  Anesthesia Type:General  Level of Consciousness: awake, alert , oriented and patient cooperative  Airway and Oxygen Therapy: Patient Spontanous Breathing  Post-op Pain: mild  Post-op Assessment: Patient's Cardiovascular Status Stable, Respiratory Function Stable, No signs of Nausea or vomiting and Pain level controlled              Post-op Vital Signs: Reviewed and stable  Last Vitals:  Filed Vitals:   06/23/15 0600  BP: 111/77  Pulse: 80  Temp: 37.1 C  Resp: 18    Complications: No apparent anesthesia complications

## 2015-06-23 NOTE — Addendum Note (Signed)
Addendum  created 06/23/15 0950 by Georgeanne Nim, CRNA   Modules edited: Notes Section   Notes Section:  File: [id number]

## 2015-06-23 NOTE — Progress Notes (Signed)
1 Day Post-Op Procedure(s) (LRB): ROBOTIC ASSISTED TOTAL HYSTERECTOMY WITH BILATERAL SALPINGECTOMY,  (Bilateral) CYSTOSCOPY (N/A)  Subjective: Patient reports incisional pain, tolerating PO, + flatus and no problems voiding.   Does report pain in her shoulders when she moves and takes deep breaths.  Able to move arms well.  Denies dizziness or lightheadedness.  States vaginal bleeding is minimal.  Not wearing a pad.  Objective: I have reviewed patient's vital signs, intake and output and labs. T 98.7, BP 111/77, 80, RR 18. I/O - 2965 cc/2525 cc. Hgb 10.5  General: alert and cooperative Resp: clear to auscultation bilaterally Cardio: regular rate and rhythm, S1, S2 normal, no murmur, click, rub or gallop GI: incision: clean, dry and intact and Abdomen soft, tender along incisions, positive normal bowel sounds.  Extremities: Ted hose on.   Assessment: s/p Procedure(s): ROBOTIC ASSISTED TOTAL HYSTERECTOMY WITH BILATERAL SALPINGECTOMY,  (Bilateral) CYSTOSCOPY (N/A): progressing well and and ready for discharge.   Anemia acceptable.  Clinically doing well.   Plan: Discharge home  Instructions reviewed and precautions given.  Percocet and Motrin for pain.  Iron q day to bid as tolerated.  Follow up in one week.      Brook A Quincy Simmonds 06/23/2015, 7:48 AM

## 2015-06-23 NOTE — Discharge Instructions (Signed)
Total Laparoscopic Hysterectomy, Care After °Refer to this sheet in the next few weeks. These instructions provide you with information on caring for yourself after your procedure. Your health care provider may also give you more specific instructions. Your treatment has been planned according to current medical practices, but problems sometimes occur. Call your health care provider if you have any problems or questions after your procedure. °WHAT TO EXPECT AFTER THE PROCEDURE °· Pain and bruising at the incision sites. You will be given pain medicine to control it. °· Menopausal symptoms such as hot flashes, night sweats, and insomnia if your ovaries were removed. °· Sore throat from the breathing tube that was inserted during surgery. °HOME CARE INSTRUCTIONS °· Only take over-the-counter or prescription medicines for pain, discomfort, or fever as directed by your health care provider.   °· Do not take aspirin. It can cause bleeding.   °· Do not drive when taking pain medicine.   °· Follow your health care provider's advice regarding diet, exercise, lifting, driving, and general activities.   °· Resume your usual diet as directed and allowed.   °· Get plenty of rest and sleep.   °· Do not douche, use tampons, or have sexual intercourse for at least 6 weeks, or until your health care provider gives you permission.   °· Change your bandages (dressings) as directed by your health care provider.   °· Monitor your temperature and notify your health care provider of a fever.   °· Take showers instead of baths for 2-3 weeks.   °· Do not drink alcohol until your health care provider gives you permission.   °· If you develop constipation, you may take a mild laxative with your health care provider's permission. Bran foods may help with constipation problems. Drinking enough fluids to keep your urine clear or pale yellow may help as well.   °· Try to have someone home with you for 1-2 weeks to help around the house.    °· Keep all of your follow-up appointments as directed by your health care provider.   °SEEK MEDICAL CARE IF: °· You have swelling, redness, or increasing pain around your incision sites.   °· You have pus coming from your incision.   °· You notice a bad smell coming from your incision.   °· Your incision breaks open.   °· You feel dizzy or lightheaded.   °· You have pain or bleeding when you urinate.   °· You have persistent diarrhea.   °· You have persistent nausea and vomiting.   °· You have abnormal vaginal discharge.   °· You have a rash.   °· You have any type of abnormal reaction or develop an allergy to your medicine.   °· You have poor pain control with your prescribed medicine.   °SEEK IMMEDIATE MEDICAL CARE IF: °· You have chest pain or shortness of breath. °· You have severe abdominal pain that is not relieved with pain medicine. °· You have pain or swelling in your legs. °MAKE SURE YOU: °· Understand these instructions. °· Will watch your condition. °· Will get help right away if you are not doing well or get worse. °  °This information is not intended to replace advice given to you by your health care provider. Make sure you discuss any questions you have with your health care provider. °  °Document Released: 05/28/2013 Document Revised: 08/12/2013 Document Reviewed: 05/28/2013 °Elsevier Interactive Patient Education ©2016 [employer] ° °

## 2015-06-23 NOTE — Progress Notes (Signed)
Pt verbalizes understanding of d/c instructions, medications, follow up appts, when to seek medical advice and belongings policy. No questions at this time. IV was removed by NT. Pt ambulated to main entrance accompanied by NT. Pts SO is waiting at main entrance and will be driving her home. Marry Guan

## 2015-06-25 ENCOUNTER — Telehealth: Payer: Self-pay | Admitting: Obstetrics and Gynecology

## 2015-06-25 NOTE — Telephone Encounter (Signed)
I have called the pathology department to ask for Dr. Saralyn Pilar to go a second reading of the patient's slides for her conization and her hysterectomy specimen.  He is not in the office at this time but will be back on 06/28/15.

## 2015-06-25 NOTE — Telephone Encounter (Signed)
Phone call to discuss pathology report from hysterectomy.  I left a message that I was "calling to share good news." No further details left.  I asked for the patient to return my call this afternoon if possible.   Final pathology did not show any cancer or precancer.  Fibroids were seen in the uterus.

## 2015-06-25 NOTE — Telephone Encounter (Signed)
Phone call to discuss the pathology report showing no residual dysplasia.  Patient and I are both surprised by this but acknowledge that we did not have a way to know this in advance given the positive ECC showing CIN II and III at the conization procedure.  The cone was done first and then the ECC was taken above the level of the cone.  I will ask the pathology department to reread the slides.  Patient is doing well from a recovery standpoint and has her postop appointment on Monday, 06/28/15 at 1:00 pm.

## 2015-06-26 NOTE — Discharge Summary (Signed)
Physician Discharge Summary  Patient ID: Tara Velazquez MRN: [id number] DOB/AGE: October 01, 1980 34 y.o.  Admit date: 06/22/2015 Discharge date:  06/23/2015  Admission Diagnoses: 1.  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 2.  Cervical conization showing CIN II and ECC showing CIN II/III.  Discharge Diagnoses:  1.  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 2.  Cervical conization showing CIN II and ECC showing CIN II/III. 3.  Status post total laparoscopic hysterectomy with bilateral salpingectomy and cystoscopy.  Active Problems:   Status post laparoscopic hysterectomy   Discharged Condition: good  Hospital Course:  The patient was admitted on 06/22/15 for a robotic total laparoscopic hysterectomy with bilateral salpingectomy and cystoscopy which were performed without complication while under general anesthesia.  The patient's post op course was uneventful.  She had IV morphine and Toradol for pain control initially, and this was converted over to Percocet and Motrin on post op day one when the patient began taking po well.  She was able to ambulate independently and wore PAS and Ted hose for DVT prophylaxis while in bed.  Her foley catheter were removed on post op day zero, and she voided good volumes. The patient's vital signs remained stable and she demonstrated no signs of infection during her hospitalization.  The patient's post op day zero Hgb was 11.6 and her post op day one Hgb was 10.5.   She was tolerating the this well.  She had very minimal vaginal bleeding, and her incision(s) demonstrated no signs of erythema or significant drainage.  She has shoulder pain with movement and taking deep breaths, and this was attributed to the intraoperative pneumoperitoneum.  This pain improved with being upright and getting out of bed.  She was found to be in good condition and ready for discharge on post op day  one.  Consults: None  Significant Diagnostic Studies: labs:  See Hospital Course.  Treatments: surgery:  Robotic total laparoscopic hysterectomy with bilateral salpingectomy and cystoscopy.   Discharge Exam: Blood pressure 111/77, pulse 80, temperature 98.7 F (37.1 C), temperature source Oral, resp. rate 18, height 5\' 7"  (1.702 m), weight 190 lb (86.183 kg), SpO2 99 %. General: alert and cooperative Resp: clear to auscultation bilaterally Cardio: regular rate and rhythm, S1, S2 normal, no murmur, click, rub or gallop GI: incision: clean, dry and intact and Abdomen soft, tender along incisions, positive normal bowel sounds.  Extremities: Ted hose on.   Disposition: 01-Home or Self Care  Post op instructions and precautions were reviewed in verbal and written form.     Medication List    TAKE these medications        ALPRAZolam 0.25 MG tablet  Commonly known as:  XANAX  Take one tablet by mouth up to three times a day as needed for anxiety.     buPROPion 150 MG 12 hr tablet  Commonly known as:  WELLBUTRIN SR  TAKE ONE TABLET BY MOUTH TWICE DAILY     ferrous sulfate 325 (65 FE) MG tablet  Take 325 mg by mouth daily with breakfast.     ibuprofen 600 MG tablet  Commonly known as:  ADVIL,MOTRIN  Take 1 tablet (600 mg total) by mouth every 6 (six) hours as needed (mild pain).     oxyCODONE-acetaminophen 5-325 MG tablet  Commonly known as:  PERCOCET/ROXICET  Take 1-2 tablets by mouth every 4 (four) hours as needed for severe pain (moderate to severe pain (when  tolerating fluids)).        The patient will follow up in approximately one week in office.  Signed: Arloa Koh 06/26/2015, 7:24 PM

## 2015-06-28 ENCOUNTER — Encounter: Payer: Self-pay | Admitting: *Deleted

## 2015-06-28 ENCOUNTER — Ambulatory Visit (INDEPENDENT_AMBULATORY_CARE_PROVIDER_SITE_OTHER): Payer: BLUE CROSS/BLUE SHIELD | Admitting: Obstetrics and Gynecology

## 2015-06-28 ENCOUNTER — Encounter: Payer: Self-pay | Admitting: Obstetrics and Gynecology

## 2015-06-28 VITALS — BP 122/78 | HR 88 | Ht 65.75 in | Wt 189.4 lb

## 2015-06-28 DIAGNOSIS — D649 Anemia, unspecified: Secondary | ICD-10-CM

## 2015-06-28 DIAGNOSIS — Z9889 Other specified postprocedural states: Secondary | ICD-10-CM

## 2015-06-28 LAB — HEMOGLOBIN, FINGERSTICK: HEMOGLOBIN, FINGERSTICK: 13.5 g/dL (ref 12.0–16.0)

## 2015-06-28 MED ORDER — OXYCODONE-ACETAMINOPHEN 5-325 MG PO TABS: 1.0000 | ORAL_TABLET | ORAL | Status: DC | PRN

## 2015-06-28 NOTE — Progress Notes (Signed)
Patient ID: Tara Velazquez, female   DOB: 07-05-1981, 34 y.o.   MRN: [id number] GYNECOLOGY  VISIT   HPI: 34 y.o.   Married  Caucasian  female   [phone] with Patient's last menstrual period was 06/06/2015 (approximate).   here for 1 week follow up [facility] SALPINGECTOMY,  LYSIS OF OMENTAL ADHESIONS, CYSTOSCOPY   Patient complains of some RLQ tenderness today. Some general tenderness in the mornings.  During the day, does OK.  Taking Percocet and Motrin.  Feels like she would like Percocet for another day or two.   No vaginal bleeding.   Took Dulcolax yesterday and had a bowel movement.  Voiding well.   Final pathology showing no dysplasia and is showing fibroids. Patient is surprised with this, and I am indicating that I am surprised as well.   Smokes up to one pack per day.   Hgb 13.5 today - finger stick.  Hgb 10.5 at discharge from hospital.   GYNECOLOGIC HISTORY: Patient's last menstrual period was 06/06/2015 (approximate). Contraception: Tubal/Hysterect Menopausal hormone therapy: n/a Last mammogram: n/a Last pap smear: 03-08-15 Neg:Pos HR HPV; HPV Genotype 16 Pos.        OB History    Gravida Para Term Preterm AB TAB SAB Ectopic Multiple Living   3 3 3       3          Patient Active Problem List   Diagnosis Date Noted  . Status post laparoscopic hysterectomy 06/22/2015    Past Medical History  Diagnosis Date  . Depression   . Nystagmus     her whole life, sees [facility]  . Anemia     d/t heavy menstrual cycles  . Anxiety   . Dysmenorrhea   . Abnormal Pap smear of cervix 03-08-15    --normal pap:Pos HR HPV w/#16 Pos genotype--colposcopy ECC at least high grade dysplasia;cannot exclude adenocarcinoma insitu  . Cancer [facility])     cervical pre cancerous cell    Past Surgical History  Procedure Laterality Date  . Cesarean section      2009, 2006, and 2004  . Wisdom tooth extraction    . Cervical conization w/bx  N/A 04/27/2015    Procedure: CONIZATION CERVIX WITH BIOPSY with ECC;  Surgeon: Nunzio Cobbs, MD;  Location: [place] [facility];  Service: Gynecology;  Laterality: N/A;  . Cystoscopy N/A 06/22/2015    Procedure: CYSTOSCOPY;  Surgeon: Nunzio Cobbs, MD;  Location: [place] [facility];  Service: Gynecology;  Laterality: N/A;  . Abdominal hysterectomy  06-22-15    R-TLH w/Bil.Salpingectomy    Current Outpatient Prescriptions  Medication Sig Dispense Refill  . ALPRAZolam (XANAX) 0.25 MG tablet Take one tablet by mouth up to three times a day as needed for anxiety. 15 tablet 0  . buPROPion (WELLBUTRIN SR) 150 MG 12 hr tablet TAKE ONE TABLET BY MOUTH TWICE DAILY 60 tablet 0  . ferrous sulfate 325 (65 FE) MG tablet Take 325 mg by mouth daily with breakfast.    . ibuprofen (ADVIL,MOTRIN) 600 MG tablet Take 1 tablet (600 mg total) by mouth every 6 (six) hours as needed (mild pain). 30 tablet 0  . oxyCODONE-acetaminophen (PERCOCET/ROXICET) 5-325 MG tablet Take 1-2 tablets by mouth every 4 (four) hours as needed for severe pain (moderate to severe pain (when tolerating fluids)). 30 tablet 0   No current facility-administered medications for this visit.     ALLERGIES: Amoxicillin and Coconut flavor  Family History  Problem Relation Age of Onset  . Colon cancer Father   . Cancer Brother     had hysterectomy at age 67  . Diabetes Maternal Grandmother   . Diabetes Paternal Grandfather   . Cancer Sister 34    Dec from MVA--but had hysterectomy age 87 ?CA (pt. states mom told her -- sister dx'd with germ induced ovarian ca)  . Hyperlipidemia Mother   . Stroke Maternal Grandfather     Social History   Social History  . Marital Status: Married    Spouse Name: N/A  . Number of Children: N/A  . Years of Education: N/A   Occupational History  . Not on file.   Social History Main Topics  . Smoking status: Current Every Day Smoker -- 1.00 packs/day for 12 years    Types: Cigarettes  . Smokeless  tobacco: Never Used  . Alcohol Use: 1.8 oz/week    3 Standard drinks or equivalent per week     Comment: once every couple of weeks   . Drug Use: No  . Sexual Activity:    Partners: Male    Birth Control/ Protection: Surgical     Comment: Tubal/Hyst   Other Topics Concern  . Not on file   Social History Narrative   Work or School: homemaker      Home Situation: lives with husband and 3 children      Spiritual Beliefs: none      Lifestyle: walks/jogs 2 times per week/ diet is poor          ROS:  Pertinent items are noted in HPI.  PHYSICAL EXAMINATION:    BP 122/78 mmHg  Pulse 88  Ht 5' 5.75" (1.67 m)  Wt 189 lb 6.4 oz (85.911 kg)  BMI 30.80 kg/m2  LMP 06/06/2015 (Approximate)    General appearance: alert, cooperative and appears stated age   Abdomen: incisions intact, soft, non-tender; bowel sounds normal; no masses,  no organomegaly  ASSESSMENT  Status post robotic total laparoscopic hysterectomy with bilateral salpingectomy, lysis of adhesions, and cystoscopy.  Recovering well.  Pathology report showing no residual dysplasia. Prior evaluation showed possible adenocarcinoma in situ on colposcopy and then CIN II on cone with CIN II and III on ECC done above the level of the cone.  Hx of positive HR HPV.  Smoker. Post op anemia.   PLAN  Counseled regarding pathology reports again.  Will have [facility] reread the pathology specimens including the pap and colpo biopsies from [facility].   This is being facilitated today.  Rx for Percocet.  See orders.  OK to stop iron.  Discussion of smoking cessation program through [facility]  Will mail a flyer to patient. She understands this will have a positive impact on her future vaginal health and her HPV status.  I am recommending she do yearly paps following this hysterectomy. Follow up for 6 week post of check, sooner as needed.  Will contact patient when rereading of pathology reports is back.  I will contact the patient  by phone and then she may have an appointment to discuss further if she chooses.   An After Visit Summary was printed and given to the patient.

## 2015-07-08 ENCOUNTER — Telehealth: Payer: Self-pay | Admitting: *Deleted

## 2015-07-08 NOTE — Telephone Encounter (Signed)
Call to patient. Advised that pathologist has received slides and we will notify her as soon as we receive any additional information. Patient requests information on smoking cessation program previously discussed with Dr Quincy Simmonds. Advised I would mail her the brochure.  Routing to provider for final review. Patient agreeable to disposition. Will close encounter.

## 2015-07-14 ENCOUNTER — Inpatient Hospital Stay (HOSPITAL_COMMUNITY): Admission: RE | Admit: 2015-07-14 | Payer: Self-pay | Source: Ambulatory Visit

## 2015-07-14 ENCOUNTER — Telehealth: Payer: Self-pay | Admitting: Obstetrics and Gynecology

## 2015-07-14 LAB — CYTOLOGY - PAP

## 2015-07-14 NOTE — Telephone Encounter (Signed)
Routing to Dr Sabra Heck for review while Dr Quincy Simmonds out of office.

## 2015-07-14 NOTE — Telephone Encounter (Signed)
Spoke to Dr. Claudette Laws.  He reviewed Pap, colposcopic biopsy and ECC, cone biopsy, and final pathology from her hysterectomy.  He agreed with all of the results except that he feels her Pap may have a few LGSIL appearing cells.  Pap was read as normal with +HR HPV testing.  He felts progression of care and recommendations were all completely appropriate given a 34 yo pt with three pregnancies and deliveries.  Also had another pathologist at [facility] look at all specimens and there was agreement with the additional pathologist.    He will send a report to Dr. Quincy Simmonds.  He will not be working on Thanksgiving but will be reading pathology on Friday.  If Dr. Quincy Simmonds has any questions, he provided his cell number [phone]) and stated he'd be "happy" to talk about this if there were additional questions.  Pt has not been called with the above information at this time.  Routine to Dr. Quincy Simmonds for additional instructions.

## 2015-07-14 NOTE — Telephone Encounter (Signed)
Dr. Claudette Laws called to speak with Dr. Quincy Simmonds in regards to patient.

## 2015-07-20 ENCOUNTER — Encounter: Payer: Self-pay | Admitting: Obstetrics and Gynecology

## 2015-07-21 ENCOUNTER — Telehealth: Payer: Self-pay | Admitting: Obstetrics and Gynecology

## 2015-07-21 NOTE — Telephone Encounter (Signed)
Patient returning your call [phone].

## 2015-07-21 NOTE — Telephone Encounter (Signed)
Phone call to patient regarding final pathology review by Dr. Saralyn Pilar.   Dr. Saralyn Pilar has reviewed all of the patients slides including the [facility] slides and the [facility] slides.  There is concordance in the readings except that the pap may have contained some LGSIL cells as well.  Patient is asking about the possibility of having HGSIL on ECC at the time of the conization and then having a negative cervix on final hysterectomy specimen.  I told her that this is possible, but that there is no way to know what the final pathology reading would be until another follow specimen were analyzed such as another colposcopic evaluation, another conization, or hysterectomy.  We reviewed that we discussed all of these options, including GYN oncology consultation after her conization, and that she chose hysterectomy.   She states she understands everything and does state that it is "bitter sweet."  She states she is doing well overall following the hysterectomy.  She will come in for her 6 week post op check in 2 weeks.   I am recommending she have follow up pap and HR HPV testing yearly for at least 2 years.  She understands that the HR HPV is not eradicated by the hysterectomy.

## 2015-07-21 NOTE — Telephone Encounter (Signed)
I left message to return my phone call.  No details left.   I am wanting to share the results of the rereading of her pathology reports with her.  The previous findings were confirmed by two additional pathologists in the [place].

## 2015-08-06 ENCOUNTER — Ambulatory Visit (INDEPENDENT_AMBULATORY_CARE_PROVIDER_SITE_OTHER): Payer: BLUE CROSS/BLUE SHIELD | Admitting: Obstetrics and Gynecology

## 2015-08-06 ENCOUNTER — Encounter: Payer: Self-pay | Admitting: Obstetrics and Gynecology

## 2015-08-06 VITALS — BP 118/70 | HR 76 | Ht 65.75 in | Wt 192.8 lb

## 2015-08-06 DIAGNOSIS — R208 Other disturbances of skin sensation: Secondary | ICD-10-CM

## 2015-08-06 DIAGNOSIS — Z9889 Other specified postprocedural states: Secondary | ICD-10-CM

## 2015-08-06 DIAGNOSIS — R200 Anesthesia of skin: Secondary | ICD-10-CM

## 2015-08-06 LAB — CBC
HCT: 42.2 % (ref 36.0–46.0)
Hemoglobin: 13.9 g/dL (ref 12.0–15.0)
MCH: 28.2 pg (ref 26.0–34.0)
MCHC: 32.9 g/dL (ref 30.0–36.0)
MCV: 85.6 fL (ref 78.0–100.0)
MPV: 9.3 fL (ref 8.6–12.4)
PLATELETS: 233 10*3/uL (ref 150–400)
RBC: 4.93 MIL/uL (ref 3.87–5.11)
RDW: 13.6 % (ref 11.5–15.5)
WBC: 5.2 10*3/uL (ref 4.0–10.5)

## 2015-08-06 LAB — FERRITIN: FERRITIN: 15 ng/mL (ref 10–291)

## 2015-08-06 LAB — IRON: Iron: 69 ug/dL (ref 40–190)

## 2015-08-06 NOTE — Progress Notes (Signed)
Patient ID: Tara Velazquez, female   DOB: [birth year], 34 y.o.   MRN: [id number] GYNECOLOGY  VISIT   HPI: 34 y.o.   Married  Caucasian  female   [phone] with Patient's last menstrual period was 06/06/2015 (approximate).   here for 6 week follow up [facility] SALPINGECTOMY,   CYSTOSCOPY    No bleeding or pain.   Feels numbness in her hands so she is concerned about anemia.  Has had this before.   Hgb today - 13.4  GYNECOLOGIC HISTORY: Patient's last menstrual period was 06/06/2015 (approximate). Contraception: Tubal/Hysterectomy Menopausal hormone therapy: none Last mammogram: n/a Last pap smear: 03-08-15 Neg:Pos HR HPV;HPV Genotype 16 Pos.        OB History    Gravida Para Term Preterm AB TAB SAB Ectopic Multiple Living   3 3 3       3          Patient Active Problem List   Diagnosis Date Noted  . Status post laparoscopic hysterectomy 06/22/2015    Past Medical History  Diagnosis Date  . Depression   . Nystagmus     her whole life, sees [facility]  . Anemia     d/t heavy menstrual cycles  . Anxiety   . Dysmenorrhea   . Abnormal Pap smear of cervix 03-08-15    --normal pap:Pos HR HPV w/#16 Pos genotype--colposcopy ECC at least high grade dysplasia;cannot exclude adenocarcinoma insitu  . Cancer [facility])     cervical pre cancerous cell    Past Surgical History  Procedure Laterality Date  . Cesarean section      2009, 2006, and 2004  . Wisdom tooth extraction    . Cervical conization w/bx N/A 04/27/2015    Procedure: CONIZATION CERVIX WITH BIOPSY with ECC;  Surgeon: Nunzio Cobbs, MD;  Location: [facility];  Service: Gynecology;  Laterality: N/A;  . Cystoscopy N/A 06/22/2015    Procedure: CYSTOSCOPY;  Surgeon: Nunzio Cobbs, MD;  Location: [place] [facility];  Service: Gynecology;  Laterality: N/A;  . Abdominal hysterectomy  06-22-15    R-TLH w/Bil.Salpingectomy    Current Outpatient Prescriptions  Medication Sig Dispense  Refill  . ALPRAZolam (XANAX) 0.25 MG tablet Take one tablet by mouth up to three times a day as needed for anxiety. 15 tablet 0  . buPROPion (WELLBUTRIN SR) 150 MG 12 hr tablet TAKE ONE TABLET BY MOUTH TWICE DAILY 60 tablet 0  . ferrous sulfate 325 (65 FE) MG tablet Take 325 mg by mouth daily with breakfast. Reported on 08/06/2015     No current facility-administered medications for this visit.     ALLERGIES: Amoxicillin and Coconut flavor  Family History  Problem Relation Age of Onset  . Colon cancer Father   . Cancer Brother     had hysterectomy at age 50  . Diabetes Maternal Grandmother   . Diabetes Paternal Grandfather   . Cancer Sister 83    Dec from MVA--but had hysterectomy age 43 ?CA (pt. states mom told her -- sister dx'd with germ induced ovarian ca)  . Hyperlipidemia Mother   . Stroke Maternal Grandfather     Social History   Social History  . Marital Status: Married    Spouse Name: N/A  . Number of Children: N/A  . Years of Education: N/A   Occupational History  . Not on file.   Social History Main Topics  . Smoking status: Current Every Day Smoker -- 1.00  packs/day for 12 years    Types: Cigarettes  . Smokeless tobacco: Never Used  . Alcohol Use: 1.8 oz/week    3 Standard drinks or equivalent per week     Comment: once every couple of weeks   . Drug Use: No  . Sexual Activity:    Partners: Male    Birth Control/ Protection: Surgical     Comment: Tubal/Hyst   Other Topics Concern  . Not on file   Social History Narrative   Work or School: homemaker      Home Situation: lives with husband and 3 children      Spiritual Beliefs: none      Lifestyle: walks/jogs 2 times per week/ diet is poor          ROS:  Pertinent items are noted in HPI.  PHYSICAL EXAMINATION:    BP 118/70 mmHg  Pulse 76  Ht 5' 5.75" (1.67 m)  Wt 192 lb 12.8 oz (87.454 kg)  BMI 31.36 kg/m2  LMP 06/06/2015 (Approximate)    General appearance: alert, cooperative and  appears stated age   Abdomen: incisions intact, soft, non-tender;  no masses,  no organomegaly    Pelvic: External genitalia:  no lesions              Urethra:  normal appearing urethra with no masses, tenderness or lesions              Bartholins and Skenes: normal                 Vagina: normal appearing vagina with normal color and discharge, no lesions              Cervix: absent, sutures absent.  Cuff healing well.              Bimanual Exam:  Uterus:  uterus absent              Adnexa: normal adnexa and no mass, fullness, tenderness               Chaperone was present for exam.  ASSESSMENT  Status post robotic TLH/bilateral salpingectomy/cystoscopy.  Hand numbness.   Smoker. Positive HR HPV.  PLAN  Counseled regarding healing process to date.  Wait two more weeks before resuming sexual activity.  Follow up for annual exam in July 2017.  Will do pap and HR HPV testing then.  Counseling for smoking cessation.   Check CBC, iron, ferritin.    An After Visit Summary was printed and given to the patient.

## 2016-01-27 DIAGNOSIS — F3181 Bipolar II disorder: Secondary | ICD-10-CM | POA: Diagnosis not present

## 2016-02-11 DIAGNOSIS — F3181 Bipolar II disorder: Secondary | ICD-10-CM | POA: Diagnosis not present

## 2016-03-09 DIAGNOSIS — F3181 Bipolar II disorder: Secondary | ICD-10-CM | POA: Diagnosis not present

## 2016-03-16 ENCOUNTER — Ambulatory Visit (INDEPENDENT_AMBULATORY_CARE_PROVIDER_SITE_OTHER): Payer: BLUE CROSS/BLUE SHIELD | Admitting: Obstetrics and Gynecology

## 2016-03-16 ENCOUNTER — Encounter: Payer: Self-pay | Admitting: Obstetrics and Gynecology

## 2016-03-16 VITALS — BP 130/72 | HR 76 | Ht 66.25 in | Wt 190.4 lb

## 2016-03-16 DIAGNOSIS — Z0000 Encounter for general adult medical examination without abnormal findings: Secondary | ICD-10-CM | POA: Diagnosis not present

## 2016-03-16 DIAGNOSIS — Z1151 Encounter for screening for human papillomavirus (HPV): Secondary | ICD-10-CM | POA: Diagnosis not present

## 2016-03-16 DIAGNOSIS — Z01419 Encounter for gynecological examination (general) (routine) without abnormal findings: Secondary | ICD-10-CM | POA: Diagnosis not present

## 2016-03-16 LAB — CBC
HEMATOCRIT: 43.1 % (ref 35.0–45.0)
HEMOGLOBIN: 14.5 g/dL (ref 11.7–15.5)
MCH: 31 pg (ref 27.0–33.0)
MCHC: 33.6 g/dL (ref 32.0–36.0)
MCV: 92.1 fL (ref 80.0–100.0)
MPV: 9.1 fL (ref 7.5–12.5)
Platelets: 183 10*3/uL (ref 140–400)
RBC: 4.68 MIL/uL (ref 3.80–5.10)
RDW: 12.9 % (ref 11.0–15.0)
WBC: 6.4 10*3/uL (ref 3.8–10.8)

## 2016-03-16 LAB — POCT URINALYSIS DIPSTICK
BILIRUBIN UA: NEGATIVE
Blood, UA: NEGATIVE
Glucose, UA: NEGATIVE
Ketones, UA: NEGATIVE
LEUKOCYTES UA: NEGATIVE
NITRITE UA: NEGATIVE
PH UA: 5
PROTEIN UA: NEGATIVE
Urobilinogen, UA: NEGATIVE

## 2016-03-16 LAB — COMPREHENSIVE METABOLIC PANEL
ALBUMIN: 4.2 g/dL (ref 3.6–5.1)
ALT: 11 U/L (ref 6–29)
AST: 14 U/L (ref 10–30)
Alkaline Phosphatase: 43 U/L (ref 33–115)
BUN: 14 mg/dL (ref 7–25)
CALCIUM: 9 mg/dL (ref 8.6–10.2)
CHLORIDE: 107 mmol/L (ref 98–110)
CO2: 23 mmol/L (ref 20–31)
Creat: 0.88 mg/dL (ref 0.50–1.10)
Glucose, Bld: 87 mg/dL (ref 65–99)
Potassium: 4.3 mmol/L (ref 3.5–5.3)
Sodium: 138 mmol/L (ref 135–146)
Total Bilirubin: 0.9 mg/dL (ref 0.2–1.2)
Total Protein: 6.5 g/dL (ref 6.1–8.1)

## 2016-03-16 LAB — LIPID PANEL
Cholesterol: 152 mg/dL (ref 125–200)
HDL: 42 mg/dL — AB (ref >=46)
LDL Cholesterol: 90 mg/dL (ref <130)
TRIGLYCERIDES: 99 mg/dL (ref <150)
Total CHOL/HDL Ratio: 3.6 Ratio (ref <=5.0)
VLDL: 20 mg/dL (ref <30)

## 2016-03-16 NOTE — Progress Notes (Signed)
35 y.o. G59P3003 Married Caucasian female here for annual exam.    Tried Chantix for smoking cessation and felt like a zombie.   Now on Resperdal for depression/anxiety.   Father had colon CA age 80 or so.   PCP:   Colin Benton, DO  Patient's last menstrual period was 06/06/2015 (approximate).           Sexually active: Yes.   female The current method of family planning is tubal ligation/Hysterectomy.    Exercising: Yes.    walks daily. Smoker:  Yes, smokes 20 cigarettes/day  Health Maintenance: Pap:  03-08-15 Neg:Pos HR HPV History of abnormal Pap:  Yes.   Colposcopy possible adenocarcinoma in situ, conization CIN II and CIN II/III on ECC.  Final hysterectomy pathology - negative for cervical dysplasia. MMG:  n/a Colonoscopy:  n/a BMD:   n/a  Result  n/a TDaP:  06-25-14 Gardasil:   no Screening Labs:    Urine today: Neg   reports that she has been smoking Cigarettes.  She has a 12.00 pack-year smoking history. She has never used smokeless tobacco. She reports that she drinks about 3.6 oz of alcohol per week . She reports that she does not use drugs.  Past Medical History:  Diagnosis Date  . Abnormal Pap smear of cervix 03-08-15   --normal pap:Pos HR HPV w/#16 Pos genotype--colposcopy ECC at least high grade dysplasia;cannot exclude adenocarcinoma insitu  . Anemia    d/t heavy menstrual cycles  . Anxiety   . Cancer ([place])    cervical pre cancerous cell  . Depression   . Dysmenorrhea   . Nystagmus    her whole life, sees [facility]    Past Surgical History:  Procedure Laterality Date  . ABDOMINAL HYSTERECTOMY  06-22-15   R-TLH w/Bil.Salpingectomy  . CERVICAL CONIZATION W/BX N/A 04/27/2015   Procedure: CONIZATION CERVIX WITH BIOPSY with ECC;  Surgeon: Nunzio Cobbs, MD;  Location: [facility] [facility];  Service: Gynecology;  Laterality: N/A;  . CESAREAN SECTION     2009, 2006, and 2004  . CYSTOSCOPY N/A 06/22/2015   Procedure: CYSTOSCOPY;  Surgeon: Nunzio Cobbs,  MD;  Location: [place] [facility];  Service: Gynecology;  Laterality: N/A;  . WISDOM TOOTH EXTRACTION      Current Outpatient Prescriptions  Medication Sig Dispense Refill  . risperiDONE (RISPERDAL) 1 MG tablet Take 1 tablet by mouth daily.  0  . risperiDONE (RISPERDAL) 2 MG tablet Take 1 tablet by mouth daily.  1   No current facility-administered medications for this visit.     Family History  Problem Relation Age of Onset  . Colon cancer Father   . Cancer Sister 55    Dec from MVA--but had hysterectomy age 36 ?CA (pt. states mom told her -- sister dx'd with germ induced ovarian ca)  . Hyperlipidemia Mother   . Cancer Brother     had hysterectomy at age 67  . Diabetes Maternal Grandmother   . Diabetes Paternal Grandfather   . Stroke Maternal Grandfather     ROS:  Pertinent items are noted in HPI.  Otherwise, a comprehensive ROS was negative.  Exam:   LMP 06/06/2015 (Approximate)     General appearance: alert, cooperative and appears stated age Head: Normocephalic, without obvious abnormality, atraumatic Neck: no adenopathy, supple, symmetrical, trachea midline and thyroid normal to inspection and palpation Lungs: clear to auscultation bilaterally Breasts: normal appearance, no masses or tenderness, Inspection negative, No nipple retraction or dimpling, No nipple  discharge or bleeding, No axillary or supraclavicular adenopathy Heart: regular rate and rhythm Abdomen: incisions:  Yes.    laparoscopic , soft, non-tender; no masses, no organomegaly Extremities: extremities normal, atraumatic, no cyanosis or edema Skin: Skin color, texture, turgor normal. No rashes or lesions Lymph nodes: Cervical, supraclavicular, and axillary nodes normal. No abnormal inguinal nodes palpated Neurologic: Grossly normal  Pelvic: External genitalia:  no lesions              Urethra:  normal appearing urethra with no masses, tenderness or lesions              Bartholins and Skenes: normal                  Vagina: normal appearing vagina with normal color and discharge, no lesions              Cervix: absent              Pap taken: Yes.   Bimanual Exam:  Uterus:  uterus absent              Adnexa: normal adnexa and no mass, fullness, tenderness      Chaperone was present for exam.  Assessment:   Well woman visit with normal exam. Status post robotic total laparoscopic hysterectomy with bilateral salpingectomy.  Final pathology negative.  Preprocedure pathology - cervical adenocarcinoma in situ, CIN II and III. Smoker.  Plan: Yearly mammogram recommended after age 78.  Recommended self breast exam.  Pap and HR HPV yearly. Discussed Calcium, Vitamin D, regular exercise program including cardiovascular and weight bearing exercise. Labs performed.  Yes.  .   See orders. Prescription medication(s) given.  No..    Information on smoking cessation course through [facility] Follow up annually and prn.       After visit summary provided.

## 2016-03-16 NOTE — Patient Instructions (Signed)
Health Maintenance, Female Adopting a healthy lifestyle and getting preventive care can go a long way to promote health and wellness. Talk with your health care provider about what schedule of regular examinations is right for you. This is a good chance for you to check in with your provider about disease prevention and staying healthy. In between checkups, there are plenty of things you can do on your own. Experts have done a lot of research about which lifestyle changes and preventive measures are most likely to keep you healthy. Ask your health care provider for more information. WEIGHT AND DIET  Eat a healthy diet  Be sure to include plenty of vegetables, fruits, low-fat dairy products, and lean protein.  Do not eat a lot of foods high in solid fats, added sugars, or salt.  Get regular exercise. This is one of the most important things you can do for your health.  Most adults should exercise for at least 150 minutes each week. The exercise should increase your heart rate and make you sweat (moderate-intensity exercise).  Most adults should also do strengthening exercises at least twice a week. This is in addition to the moderate-intensity exercise.  Maintain a healthy weight  Body mass index (BMI) is a measurement that can be used to identify possible weight problems. It estimates body fat based on height and weight. Your health care provider can help determine your BMI and help you achieve or maintain a healthy weight.  For females 64 years of age and older:   A BMI below 18.5 is considered underweight.  A BMI of 18.5 to 24.9 is normal.  A BMI of 25 to 29.9 is considered overweight.  A BMI of 30 and above is considered obese.  Watch levels of cholesterol and blood lipids  You should start having your blood tested for lipids and cholesterol at 36 years of age, then have this test every 5 years.  You may need to have your cholesterol levels checked more often if:  Your lipid  or cholesterol levels are high.  You are older than 36 years of age.  You are at high risk for heart disease.  CANCER SCREENING   Lung Cancer  Lung cancer screening is recommended for adults 32-72 years old who are at high risk for lung cancer because of a history of smoking.  A yearly low-dose CT scan of the lungs is recommended for people who:  Currently smoke.  Have quit within the past 15 years.  Have at least a 30-pack-year history of smoking. A pack year is smoking an average of one pack of cigarettes a day for 1 year.  Yearly screening should continue until it has been 15 years since you quit.  Yearly screening should stop if you develop a health problem that would prevent you from having lung cancer treatment.  Breast Cancer  Practice breast self-awareness. This means understanding how your breasts normally appear and feel.  It also means doing regular breast self-exams. Let your health care provider know about any changes, no matter how small.  If you are in your 20s or 30s, you should have a clinical breast exam (CBE) by a health care provider every 1-3 years as part of a regular health exam.  If you are 13 or older, have a CBE every year. Also consider having a breast X-ray (mammogram) every year.  If you have a family history of breast cancer, talk to your health care provider about genetic screening.  If you  are at high risk for breast cancer, talk to your health care provider about having an MRI and a mammogram every year.  Breast cancer gene (BRCA) assessment is recommended for women who have family members with BRCA-related cancers. BRCA-related cancers include:  Breast.  Ovarian.  Tubal.  Peritoneal cancers.  Results of the assessment will determine the need for genetic counseling and BRCA1 and BRCA2 testing. Cervical Cancer Your health care provider may recommend that you be screened regularly for cancer of the pelvic organs (ovaries, uterus, and  vagina). This screening involves a pelvic examination, including checking for microscopic changes to the surface of your cervix (Pap test). You may be encouraged to have this screening done every 3 years, beginning at age 18.  For women ages 57-65, health care providers may recommend pelvic exams and Pap testing every 3 years, or they may recommend the Pap and pelvic exam, combined with testing for human papilloma virus (HPV), every 5 years. Some types of HPV increase your risk of cervical cancer. Testing for HPV may also be done on women of any age with unclear Pap test results.  Other health care providers may not recommend any screening for nonpregnant women who are considered low risk for pelvic cancer and who do not have symptoms. Ask your health care provider if a screening pelvic exam is right for you.  If you have had past treatment for cervical cancer or a condition that could lead to cancer, you need Pap tests and screening for cancer for at least 20 years after your treatment. If Pap tests have been discontinued, your risk factors (such as having a new sexual partner) need to be reassessed to determine if screening should resume. Some women have medical problems that increase the chance of getting cervical cancer. In these cases, your health care provider may recommend more frequent screening and Pap tests. Colorectal Cancer  This type of cancer can be detected and often prevented.  Routine colorectal cancer screening usually begins at 36 years of age and continues through 36 years of age.  Your health care provider may recommend screening at an earlier age if you have risk factors for colon cancer.  Your health care provider may also recommend using home test kits to check for hidden blood in the stool.  A small camera at the end of a tube can be used to examine your colon directly (sigmoidoscopy or colonoscopy). This is done to check for the earliest forms of colorectal  cancer.  Routine screening usually begins at age 45.  Direct examination of the colon should be repeated every 5-10 years through 36 years of age. However, you may need to be screened more often if early forms of precancerous polyps or small growths are found. Skin Cancer  Check your skin from head to toe regularly.  Tell your health care provider about any new moles or changes in moles, especially if there is a change in a mole's shape or color.  Also tell your health care provider if you have a mole that is larger than the size of a pencil eraser.  Always use sunscreen. Apply sunscreen liberally and repeatedly throughout the day.  Protect yourself by wearing long sleeves, pants, a wide-brimmed hat, and sunglasses whenever you are outside. HEART DISEASE, DIABETES, AND HIGH BLOOD PRESSURE   High blood pressure causes heart disease and increases the risk of stroke. High blood pressure is more likely to develop in:  People who have blood pressure in the high end  of the normal range (130-139/85-89 mm Hg).  People who are overweight or obese.  People who are African American.  If you are 90-73 years of age, have your blood pressure checked every 3-5 years. If you are 52 years of age or older, have your blood pressure checked every year. You should have your blood pressure measured twice--once when you are at a hospital or clinic, and once when you are not at a hospital or clinic. Record the average of the two measurements. To check your blood pressure when you are not at a hospital or clinic, you can use:  An automated blood pressure machine at a pharmacy.  A home blood pressure monitor.  If you are between 44 years and 4 years old, ask your health care provider if you should take aspirin to prevent strokes.  Have regular diabetes screenings. This involves taking a blood sample to check your fasting blood sugar level.  If you are at a normal weight and have a low risk for diabetes,  have this test once every three years after 36 years of age.  If you are overweight and have a high risk for diabetes, consider being tested at a younger age or more often. PREVENTING INFECTION  Hepatitis B  If you have a higher risk for hepatitis B, you should be screened for this virus. You are considered at high risk for hepatitis B if:  You were born in a country where hepatitis B is common. Ask your health care provider which countries are considered high risk.  Your parents were born in a high-risk country, and you have not been immunized against hepatitis B (hepatitis B vaccine).  You have HIV or AIDS.  You use needles to inject street drugs.  You live with someone who has hepatitis B.  You have had sex with someone who has hepatitis B.  You get hemodialysis treatment.  You take certain medicines for conditions, including cancer, organ transplantation, and autoimmune conditions. Hepatitis C  Blood testing is recommended for:  Everyone born from [birth year] through 1966.  Anyone with known risk factors for hepatitis C. Sexually transmitted infections (STIs)  You should be screened for sexually transmitted infections (STIs) including gonorrhea and chlamydia if:  You are sexually active and are younger than 36 years of age.  You are older than 36 years of age and your health care provider tells you that you are at risk for this type of infection.  Your sexual activity has changed since you were last screened and you are at an increased risk for chlamydia or gonorrhea. Ask your health care provider if you are at risk.  If you do not have HIV, but are at risk, it may be recommended that you take a prescription medicine daily to prevent HIV infection. This is called pre-exposure prophylaxis (PrEP). You are considered at risk if:  You are sexually active and do not regularly use condoms or know the HIV status of your partner(s).  You take drugs by injection.  You are sexually  active with a partner who has HIV. Talk with your health care provider about whether you are at high risk of being infected with HIV. If you choose to begin PrEP, you should first be tested for HIV. You should then be tested every 3 months for as long as you are taking PrEP.  PREGNANCY   If you are premenopausal and you may become pregnant, ask your health care provider about preconception counseling.  If you may  become pregnant, take 400 to 800 micrograms (mcg) of folic acid every day.  If you want to prevent pregnancy, talk to your health care provider about birth control (contraception). OSTEOPOROSIS AND MENOPAUSE   Osteoporosis is a disease in which the bones lose minerals and strength with aging. This can result in serious bone fractures. Your risk for osteoporosis can be identified using a bone density scan.  If you are 43 years of age or older, or if you are at risk for osteoporosis and fractures, ask your health care provider if you should be screened.  Ask your health care provider whether you should take a calcium or vitamin D supplement to lower your risk for osteoporosis.  Menopause may have certain physical symptoms and risks.  Hormone replacement therapy may reduce some of these symptoms and risks. Talk to your health care provider about whether hormone replacement therapy is right for you.  HOME CARE INSTRUCTIONS   Schedule regular health, dental, and eye exams.  Stay current with your immunizations.   Do not use any tobacco products including cigarettes, chewing tobacco, or electronic cigarettes.  If you are pregnant, do not drink alcohol.  If you are breastfeeding, limit how much and how often you drink alcohol.  Limit alcohol intake to no more than 1 drink per day for nonpregnant women. One drink equals 12 ounces of beer, 5 ounces of wine, or 1 ounces of hard liquor.  Do not use street drugs.  Do not share needles.  Ask your health care provider for help if  you need support or information about quitting drugs.  Tell your health care provider if you often feel depressed.  Tell your health care provider if you have ever been abused or do not feel safe at home.   This information is not intended to replace advice given to you by your health care provider. Make sure you discuss any questions you have with your health care provider.   Document Released: 02/20/2011 Document Revised: 08/28/2014 Document Reviewed: 07/09/2013 Elsevier Interactive Patient Education [employer].

## 2016-03-22 LAB — IPS PAP TEST WITH HPV

## 2016-03-23 ENCOUNTER — Telehealth: Payer: Self-pay

## 2016-03-23 NOTE — Telephone Encounter (Signed)
Called patient at [phone] to discuss pap results since she has not viewed her MyChart message. Left message on voicemail to call me back.

## 2016-03-23 NOTE — Telephone Encounter (Signed)
Patient returned call and notified of pap results. 08 recall entered.

## 2016-03-23 NOTE — Telephone Encounter (Signed)
-----   Message from Nunzio Cobbs, MD sent at 03/22/2016  8:19 PM EDT ----- Results to patient through My Chart. Pap and HR HPV negative.  Recall - 08.  Prior pre hysterectomy bx showing possible adenocarcinoma in situ.   "Hi Tara Velazquez,   I have good news for you!  Your pap is normal and the high risk human papilloma (HPV) virus test is negative.    We will plan to see you back next year and do a pap and HPV testing then.   Please call for any questions.   Josefa Half, MD"

## 2016-04-10 DIAGNOSIS — F3181 Bipolar II disorder: Secondary | ICD-10-CM | POA: Diagnosis not present

## 2016-05-08 DIAGNOSIS — F3181 Bipolar II disorder: Secondary | ICD-10-CM | POA: Diagnosis not present

## 2016-05-22 DIAGNOSIS — F3181 Bipolar II disorder: Secondary | ICD-10-CM | POA: Diagnosis not present

## 2016-08-21 HISTORY — PX: ABDOMINAL HYSTERECTOMY: SHX81

## 2016-09-29 ENCOUNTER — Ambulatory Visit (INDEPENDENT_AMBULATORY_CARE_PROVIDER_SITE_OTHER): Payer: BLUE CROSS/BLUE SHIELD | Admitting: Osteopathic Medicine

## 2016-09-29 ENCOUNTER — Encounter: Payer: Self-pay | Admitting: Osteopathic Medicine

## 2016-09-29 VITALS — BP 119/62 | HR 82 | Ht 67.0 in | Wt 187.0 lb

## 2016-09-29 DIAGNOSIS — F329 Major depressive disorder, single episode, unspecified: Secondary | ICD-10-CM

## 2016-09-29 DIAGNOSIS — R5383 Other fatigue: Secondary | ICD-10-CM | POA: Diagnosis not present

## 2016-09-29 DIAGNOSIS — R0789 Other chest pain: Secondary | ICD-10-CM

## 2016-09-29 DIAGNOSIS — F418 Other specified anxiety disorders: Secondary | ICD-10-CM | POA: Diagnosis not present

## 2016-09-29 DIAGNOSIS — Z114 Encounter for screening for human immunodeficiency virus [HIV]: Secondary | ICD-10-CM

## 2016-09-29 DIAGNOSIS — R079 Chest pain, unspecified: Secondary | ICD-10-CM

## 2016-09-29 DIAGNOSIS — Z8659 Personal history of other mental and behavioral disorders: Secondary | ICD-10-CM

## 2016-09-29 DIAGNOSIS — F32A Depression, unspecified: Secondary | ICD-10-CM

## 2016-09-29 DIAGNOSIS — F419 Anxiety disorder, unspecified: Secondary | ICD-10-CM

## 2016-09-29 DIAGNOSIS — K219 Gastro-esophageal reflux disease without esophagitis: Secondary | ICD-10-CM | POA: Diagnosis not present

## 2016-09-29 DIAGNOSIS — F4000 Agoraphobia, unspecified: Secondary | ICD-10-CM

## 2016-09-29 LAB — CBC WITH DIFFERENTIAL/PLATELET
BASOS PCT: 1 %
Basophils Absolute: 73 cells/uL (ref 0–200)
EOS ABS: 146 {cells}/uL (ref 15–500)
Eosinophils Relative: 2 %
HEMATOCRIT: 43.4 % (ref 35.0–45.0)
HEMOGLOBIN: 14.6 g/dL (ref 11.7–15.5)
Lymphocytes Relative: 25 %
Lymphs Abs: 1825 cells/uL (ref 850–3900)
MCH: 30.7 pg (ref 27.0–33.0)
MCHC: 33.6 g/dL (ref 32.0–36.0)
MCV: 91.4 fL (ref 80.0–100.0)
MONO ABS: 365 {cells}/uL (ref 200–950)
MPV: 9.2 fL (ref 7.5–12.5)
Monocytes Relative: 5 %
Neutro Abs: 4891 cells/uL (ref 1500–7800)
Neutrophils Relative %: 67 %
Platelets: 218 10*3/uL (ref 140–400)
RBC: 4.75 MIL/uL (ref 3.80–5.10)
RDW: 12.5 % (ref 11.0–15.0)
WBC: 7.3 10*3/uL (ref 3.8–10.8)

## 2016-09-29 LAB — TSH: TSH: 1.49 mIU/L

## 2016-09-29 LAB — COMPLETE METABOLIC PANEL WITH GFR
ALBUMIN: 4.3 g/dL (ref 3.6–5.1)
ALT: 9 U/L (ref 6–29)
AST: 15 U/L (ref 10–30)
Alkaline Phosphatase: 47 U/L (ref 33–115)
BILIRUBIN TOTAL: 0.7 mg/dL (ref 0.2–1.2)
BUN: 9 mg/dL (ref 7–25)
CALCIUM: 9.3 mg/dL (ref 8.6–10.2)
CO2: 27 mmol/L (ref 20–31)
CREATININE: 0.72 mg/dL (ref 0.50–1.10)
Chloride: 106 mmol/L (ref 98–110)
GFR, Est Non African American: >89 mL/min (ref >=60)
Glucose, Bld: 86 mg/dL (ref 65–99)
Potassium: 4.5 mmol/L (ref 3.5–5.3)
Sodium: 140 mmol/L (ref 135–146)
TOTAL PROTEIN: 6.6 g/dL (ref 6.1–8.1)

## 2016-09-29 LAB — LIPID PANEL
CHOLESTEROL: 155 mg/dL (ref <200)
HDL: 46 mg/dL — ABNORMAL LOW (ref >50)
LDL Cholesterol: 93 mg/dL (ref <100)
TRIGLYCERIDES: 78 mg/dL (ref <150)
Total CHOL/HDL Ratio: 3.4 Ratio (ref <5.0)
VLDL: 16 mg/dL (ref <30)

## 2016-09-29 MED ORDER — NAPROXEN 500 MG PO TABS: 500.0000 mg | ORAL_TABLET | Freq: Two times a day (BID) | ORAL | 1 refills | Status: DC | PRN

## 2016-09-29 MED ORDER — VARENICLINE TARTRATE 0.5 MG X 11 & 1 MG X 42 PO MISC
ORAL | 0 refills | Status: DC
Start: 2016-09-29 — End: 2017-03-19

## 2016-09-29 NOTE — Progress Notes (Signed)
HPI: Tara Velazquez is a 36 y.o. female  who presents to [facility] today, 09/29/16,  for chief complaint of:  Chief Complaint  Patient presents with  . Establish Care    New patient here to establish care. Patient is visibly anxious. States that she hates going to [facility] but due to some health concerns she just wants to get checked out and make sure everything is okay. If possible, not interested in starting any new medications. Only prescription on the list for her is alprazolam, she states this is prescribed by psychiatry, who she also rarely sees. Discussed patient's concerns as below.  Breast pain - about a day, R side, under breast, Ibuprofen helped, moving and deep breathing and coughing made it worse. Better today but not gone.   Chest pain - sharp, few times a week for year or two, randomly, no nighttime awakening, sometimes takes aspirin, lasts a few hours, nothing makes better or worse, if she has a soda then it happens the next day, no heartburn or burping.  No chest pain on exertion, no orthopnea.  Cough - on and off several years, (+) smoking history  Smoking - was taking Chantix, caused some nausea, stopped this. Has been off a few weeks. Would like to get back on this medication.  Weight issues - trying to lose weight and not successful. Not counting calories, no exercise regimen. Reports low energy, fatigue  Feet always cold/numb - nothing makes better or worse   Aches and pains - all over, sometimes knee, sometime foot. No specific injury/orthopedic history.  Psych/Bipolar - was on mood stabilizer Risperdone caused weight gain so stopped this. Was on Buspar and Xanax for anxiety. Takes Xanax before going into public.      Past medical, surgical, social and family history reviewed: Patient Active Problem List   Diagnosis Date Noted  . Status post laparoscopic hysterectomy 06/22/2015   Past Surgical History:  Procedure  Laterality Date  . ABDOMINAL HYSTERECTOMY  06-22-15   R-TLH w/Bil.Salpingectomy  . CERVICAL CONIZATION W/BX N/A 04/27/2015   Procedure: CONIZATION CERVIX WITH BIOPSY with ECC;  Surgeon: Nunzio Cobbs, MD;  Location: [facility];  Service: Gynecology;  Laterality: N/A;  . CESAREAN SECTION     2009, 2006, and 2004  . CYSTOSCOPY N/A 06/22/2015   Procedure: CYSTOSCOPY;  Surgeon: Nunzio Cobbs, MD;  Location: [facility];  Service: Gynecology;  Laterality: N/A;  . WISDOM TOOTH EXTRACTION     Social History  Substance Use Topics  . Smoking status: Current Every Day Smoker    Packs/day: 1.00    Years: 12.00    Types: Cigarettes  . Smokeless tobacco: Never Used  . Alcohol use 3.6 oz/week    3 Standard drinks or equivalent, 1 Glasses of wine, 1 Cans of beer, 1 Shots of liquor per week     Comment: once every couple of weeks    Family History  Problem Relation Age of Onset  . Colon cancer Father   . Cancer Sister 35    Dec from MVA--but had hysterectomy age 66 ?CA (pt. states mom told her -- sister dx'd with germ induced ovarian ca)  . Hyperlipidemia Mother   . Cancer Brother     had hysterectomy at age 71  . Diabetes Maternal Grandmother   . Diabetes Paternal Grandfather   . Stroke Maternal Grandfather      Current medication list and allergy/intolerance information reviewed:  Current Outpatient Prescriptions  Medication Sig Dispense Refill  . ALPRAZolam (XANAX) 0.25 MG tablet Take 0.25 mg by mouth.  0   No current facility-administered medications for this visit.    Allergies  Allergen Reactions  . Amoxicillin Hives    Not sure if ever had penicillin  . Coconut Flavor Swelling      Review of Systems:  Constitutional:  No  fever, no chills, No recent illness, No unintentional weight changes. + fatigue.   HEENT: No  headache, no vision change, no hearing change, No sore throat, No  sinus pressure  Cardiac: No  chest pain, No  pressure, No palpitations, No   Orthopnea  Respiratory:  No  shortness of breath. No  Cough  Gastrointestinal: No  abdominal pain, No  nausea, No  vomiting,  No  blood in stool, No  diarrhea, No  constipation   Musculoskeletal: No new myalgia/arthralgia  Genitourinary: No  incontinence, No  abnormal genital bleeding, No abnormal genital discharge  Skin: No  Rash, No other wounds/concerning lesions  Hem/Onc: No  easy bruising/bleeding, No  abnormal lymph node  Endocrine: No cold intolerance,  No heat intolerance. No polyuria/polydipsia/polyphagia   Neurologic: No  weakness, No  dizziness, No  slurred speech/focal weakness/facial droop  Psychiatric: No  concerns with depression, No  concerns with anxiety, No sleep problems, No mood problems  Exam:  BP 119/62   Pulse 82   Ht '5\' 7"'$  (1.702 m)   Wt 187 lb (84.8 kg)   LMP 06/06/2015 (Approximate)   BMI 29.29 kg/m   Constitutional: VS see above. General Appearance: alert, well-developed, well-nourished, NAD  Eyes: Normal lids and conjunctive, non-icteric sclera  Ears, Nose, Mouth, Throat: MMM, Normal external inspection ears/nares/mouth/lips/gums. TM normal bilaterally. Pharynx/tonsils no erythema, no exudate. Nasal mucosa normal.   Neck: No masses, trachea midline. No thyroid enlargement. No tenderness/mass appreciated. No lymphadenopathy  Respiratory: Normal respiratory effort. no wheeze, no rhonchi, no rales  Cardiovascular: S1/S2 normal, no murmur, no rub/gallop auscultated. RRR. No lower extremity edema  Gastrointestinal: Nontender, no masses.  Musculoskeletal: Gait normal. No clubbing/cyanosis of digits. (+)TTP mild below R breast    Neurological: Normal balance/coordination. No tremor. No cranial nerve deficit on limited exam. Motor intact and symmetric  Skin: warm, dry, intact. No rash/ulcer. No concerning nevi or subq nodules on limited exam.    Psychiatric: Normal judgment/insight. Very anxious mood and affect. Oriented x3.   BREAST: No  rashes/skin changes, normal fibrous breast tissue, no masses or tenderness, normal nipple without discharge, normal axilla  EKG interpretation: Rhythm: sinus No ST/T changes concerning for acute ischemia/infarct   Results for orders placed or performed in visit on 09/29/16 (from the past 72 hour(s))  CBC with Differential/Platelet     Status: None   Collection Time: 09/29/16 11:15 AM  Result Value Ref Range   WBC 7.3 3.8 - 10.8 K/uL   RBC 4.75 3.80 - 5.10 MIL/uL   Hemoglobin 14.6 11.7 - 15.5 g/dL   HCT 43.4 35.0 - 45.0 %   MCV 91.4 80.0 - 100.0 fL   MCH 30.7 27.0 - 33.0 pg   MCHC 33.6 32.0 - 36.0 g/dL   RDW 12.5 11.0 - 15.0 %   Platelets 218 140 - 400 K/uL   MPV 9.2 7.5 - 12.5 fL   Neutro Abs 4,891 1,500 - 7,800 cells/uL   Lymphs Abs 1,825 850 - 3,900 cells/uL   Monocytes Absolute 365 200 - 950 cells/uL   Eosinophils Absolute 146 15 -  500 cells/uL   Basophils Absolute 73 0 - 200 cells/uL   Neutrophils Relative % 67 %   Lymphocytes Relative 25 %   Monocytes Relative 5 %   Eosinophils Relative 2 %   Basophils Relative 1 %   Smear Review Criteria for review not met   COMPLETE METABOLIC PANEL WITH GFR     Status: None   Collection Time: 09/29/16 11:15 AM  Result Value Ref Range   Sodium 140 135 - 146 mmol/L   Potassium 4.5 3.5 - 5.3 mmol/L   Chloride 106 98 - 110 mmol/L   CO2 27 20 - 31 mmol/L   Glucose, Bld 86 65 - 99 mg/dL   BUN 9 7 - 25 mg/dL   Creat 0.72 0.50 - 1.10 mg/dL   Total Bilirubin 0.7 0.2 - 1.2 mg/dL   Alkaline Phosphatase 47 33 - 115 U/L   AST 15 10 - 30 U/L   ALT 9 6 - 29 U/L   Total Protein 6.6 6.1 - 8.1 g/dL   Albumin 4.3 3.6 - 5.1 g/dL   Calcium 9.3 8.6 - 10.2 mg/dL   GFR, Est African American >89 >=60 mL/min   GFR, Est Non African American >89 >=60 mL/min  Lipid panel     Status: Abnormal   Collection Time: 09/29/16 11:15 AM  Result Value Ref Range   Cholesterol 155 <200 mg/dL   Triglycerides 78 <150 mg/dL   HDL 46 (L) >50 mg/dL   Total CHOL/HDL  Ratio 3.4 <5.0 Ratio   VLDL 16 <30 mg/dL   LDL Cholesterol 93 <100 mg/dL  TSH     Status: None   Collection Time: 09/29/16 11:15 AM  Result Value Ref Range   TSH 1.49 mIU/L    Comment:   Reference Range   > or = 20 Years  0.40-4.50   Pregnancy Range First trimester  0.26-2.66 Second trimester 0.55-2.73 Third trimester  0.43-2.91     HIV antibody     Status: None   Collection Time: 09/29/16 11:15 AM  Result Value Ref Range   HIV 1&2 Ab, 4th Generation NONREACTIVE NONREACTIVE    Comment:   HIV-1 antigen and HIV-1/HIV-2 antibodies were not detected.  There is no laboratory evidence of HIV infection.   HIV-1/2 Antibody Diff        Not indicated. HIV-1 RNA, Qual TMA          Not indicated.     PLEASE NOTE: This information has been disclosed to you from records whose confidentiality may be protected by state law. If your state requires such protection, then the state law prohibits you from making any further disclosure of the information without the specific written consent of the person to whom it pertains, or as otherwise permitted by law. A general authorization for the release of medical or other information is NOT sufficient for this purpose.   The performance of this assay has not been clinically validated in patients less than 33 years old.   For additional information please refer to http://education.quest[facility].com/faq/FAQ106.  (This link is being provided for informational/educational purposes only.)     VITAMIN D 25 Hydroxy (Vit-D Deficiency, Fractures)     Status: None   Collection Time: 09/29/16 11:15 AM  Result Value Ref Range   Vit D, 25-Hydroxy 36 30 - 100 ng/mL    Comment: Vitamin D Status           25-OH Vitamin D        Deficiency                <  20 ng/mL        Insufficiency         20 - 29 ng/mL        Optimal             > or = 30 ng/mL   For 25-OH Vitamin D testing on patients on D2-supplementation and patients for whom quantitation of D2  and D3 fractions is required, the [facility] 25-OH VIT D, (D2,D3), LC/MS/MS is recommended: order code 20485 (patients > 2 yrs).      ASSESSMENT/PLAN:   Chest pain, unspecified type - no concerns on EKG, costochondritis versus PVC consideration. Patient declines further workup at this point, would consider stress test/echo - Plan: CBC with Differential/Platelet, COMPLETE METABOLIC PANEL WITH GFR, Lipid panel, TSH, EKG  Right-sided chest wall pain - Normal breast exam, likely costochondritis. Advised anti-inflammatories/ rest and watchful waiting  Gastroesophageal reflux disease, esophagitis presence not specified - Chest pains or happening after carbonated beverages, may be component of GERD involved. OTC medications advised  Anxiety and depression - Poorly controlled the patient declines further medication management at this time. Would advise follow-up with psychiatry. No alprazolam from me  Agoraphobia  History of bipolar disorder  Fatigue, unspecified type - Plan: CBC with Differential/Platelet, COMPLETE METABOLIC PANEL WITH GFR, Lipid panel, TSH, VITAMIN D 25 Hydroxy (Vit-D Deficiency, Fractures)  Encounter for screening for HIV - Plan: HIV antibody, VITAMIN D 25 Hydroxy (Vit-D Deficiency, Fractures)    Patient Instructions  Plan: If your symptoms continue to bother you, please let me know! I don't think anything serious is going on.   I think the chest pains may be anxiety-related, or due to musculoskeletal issue such as costochondritis (see below) or GI issue such as heartburn, or some people experience heart "spasm" type pain with premature contractions, which are not dangerous but can be caused by anxiety or caffeine. If chest pains continue, we should think about further workup. Reducing caffeine and quitting smoking should hopefully help. If severe chest pain, particularly if accompanied by shortness of breath or dizziness, should be evaluated ASAP.   For chronic aches  and pains, anxiety issues can heighten pain responses, so this can be a contributing factor. The best thing for you is quitting smoking, starting regular exercise, anti-inflammatories as needed (prescription written for this), and we have the option to treat with other medications if we are suspicious of something like Fibromyalgia.   Refilled Chantix starter pack  For cancer screening based on your age and risk factors: Mammograms age 42 Colon cancer screening age 8 Pap smear as directed by your OBGYN Lung cancer screening will not be needed if you're able to quit smokint  Other health maintenance: HIV screening once Flu shot every year Tetanus shot every 10 years Pneumonia shot if you're unable to quit smoking Routine labs for cholesterol and diabetes screening       Visit summary with medication list and pertinent instructions was printed for patient to review. All questions at time of visit were answered - patient instructed to contact office with any additional concerns. ER/RTC precautions were reviewed with the patient. Follow-up plan: Return if symptoms worsen or fail to improve. Patient mainly came today for reassurance, I advised likely would benefit from further follow-up for anxiety issues and certainly chest pain if symptoms continue/worsen. Advised as above on preventive care/health maintenance measures. Patient has a lot of concerns about family history of cancer and heart disease. Advised that the best thing she can do  herself is to stop smoking, optimize diet/exercise regimen.  Note: Total time spent 45 minutes, greater than 50% of the visit was spent face-to-face counseling and coordinating care for the following: The primary encounter diagnosis was Chest pain, unspecified type. Diagnoses of Right-sided chest wall pain, Gastroesophageal reflux disease, esophagitis presence not specified, Anxiety and depression, Agoraphobia, History of bipolar disorder, Fatigue, unspecified  type, and Encounter for screening for HIV were also pertinent to this visit.Marland Kitchen

## 2016-09-29 NOTE — Patient Instructions (Addendum)
Plan: If your symptoms continue to bother you, please let me know! I don't think anything serious is going on.   I think the chest pains may be anxiety-related, or due to musculoskeletal issue such as costochondritis (see below) or GI issue such as heartburn, or some people experience heart "spasm" type pain with premature contractions, which are not dangerous but can be caused by anxiety or caffeine. If chest pains continue, we should think about further workup. Reducing caffeine and quitting smoking should hopefully help. If severe chest pain, particularly if accompanied by shortness of breath or dizziness, should be evaluated ASAP.   For chronic aches and pains, anxiety issues can heighten pain responses, so this can be a contributing factor. The best thing for you is quitting smoking, starting regular exercise, anti-inflammatories as needed (prescription written for this), and we have the option to treat with other medications if we are suspicious of something like Fibromyalgia.   Refilled Chantix starter pack  For cancer screening based on your age and risk factors: Mammograms age 36 Colon cancer screening age 36 Pap smear as directed by your OBGYN Lung cancer screening will not be needed if you're able to quit smokint  Other health maintenance: HIV screening once Flu shot every year Tetanus shot every 10 years Pneumonia shot if you're unable to quit smoking Routine labs for cholesterol and diabetes screening

## 2016-09-30 LAB — VITAMIN D 25 HYDROXY (VIT D DEFICIENCY, FRACTURES): VIT D 25 HYDROXY: 36 ng/mL (ref 30–100)

## 2016-09-30 LAB — HIV ANTIBODY (ROUTINE TESTING W REFLEX): HIV: NONREACTIVE

## 2016-10-04 DIAGNOSIS — R0789 Other chest pain: Secondary | ICD-10-CM | POA: Insufficient documentation

## 2016-10-04 DIAGNOSIS — R079 Chest pain, unspecified: Secondary | ICD-10-CM | POA: Insufficient documentation

## 2016-10-04 DIAGNOSIS — F4000 Agoraphobia, unspecified: Secondary | ICD-10-CM | POA: Insufficient documentation

## 2016-10-04 DIAGNOSIS — F419 Anxiety disorder, unspecified: Secondary | ICD-10-CM

## 2016-10-04 DIAGNOSIS — R5383 Other fatigue: Secondary | ICD-10-CM | POA: Insufficient documentation

## 2016-10-04 DIAGNOSIS — Z8659 Personal history of other mental and behavioral disorders: Secondary | ICD-10-CM | POA: Insufficient documentation

## 2016-10-04 DIAGNOSIS — F32A Depression, unspecified: Secondary | ICD-10-CM | POA: Insufficient documentation

## 2016-10-04 DIAGNOSIS — F329 Major depressive disorder, single episode, unspecified: Secondary | ICD-10-CM | POA: Insufficient documentation

## 2017-02-09 DIAGNOSIS — M79671 Pain in right foot: Secondary | ICD-10-CM | POA: Diagnosis not present

## 2017-02-09 DIAGNOSIS — M2011 Hallux valgus (acquired), right foot: Secondary | ICD-10-CM | POA: Diagnosis not present

## 2017-02-09 DIAGNOSIS — M79672 Pain in left foot: Secondary | ICD-10-CM | POA: Diagnosis not present

## 2017-02-09 DIAGNOSIS — M2012 Hallux valgus (acquired), left foot: Secondary | ICD-10-CM | POA: Diagnosis not present

## 2017-02-09 DIAGNOSIS — L600 Ingrowing nail: Secondary | ICD-10-CM | POA: Diagnosis not present

## 2017-03-19 ENCOUNTER — Other Ambulatory Visit (HOSPITAL_COMMUNITY)
Admission: RE | Admit: 2017-03-19 | Discharge: 2017-03-19 | Disposition: A | Discharge status: Home / Self Care | Payer: BLUE CROSS/BLUE SHIELD | Source: Ambulatory Visit | Attending: Obstetrics and Gynecology | Admitting: Obstetrics and Gynecology

## 2017-03-19 ENCOUNTER — Ambulatory Visit (INDEPENDENT_AMBULATORY_CARE_PROVIDER_SITE_OTHER): Payer: BLUE CROSS/BLUE SHIELD | Admitting: Obstetrics and Gynecology

## 2017-03-19 ENCOUNTER — Encounter: Payer: Self-pay | Admitting: Obstetrics and Gynecology

## 2017-03-19 VITALS — BP 104/68 | HR 92 | Resp 16 | Ht 66.75 in | Wt 173.0 lb

## 2017-03-19 DIAGNOSIS — Z01419 Encounter for gynecological examination (general) (routine) without abnormal findings: Secondary | ICD-10-CM | POA: Insufficient documentation

## 2017-03-19 NOTE — Patient Instructions (Signed)

## 2017-03-19 NOTE — Progress Notes (Signed)
36 y.o. [id number] Married Caucasian female here for annual exam.    Saw a podiatrist due to foot issues.  May have neuropathy.  Has not seen neurology yet.  Wants to talk to me about this.  Smoking.  Tried Chantix.   Has anxiety with medical visits.  Took Xanax prior to her visit today.  Gained 10 pounds.  Reports some thirst.  Has all normal labs through PCP in February 2018.  PCP:  No PCP per patient   Patient's last menstrual period was 06/06/2015 (approximate).           Sexually active: Yes.    The current method of family planning is tubal ligation and status post hysterectomy.    Exercising: Yes.    walking Smoker:  yes  Health Maintenance: Pap:   03/16/16 Pap and HR HPV negative  03-08-15 Neg:Pos HR HPV History of abnormal Pap:   Yes.   Colposcopy possible adenocarcinoma in situ, conization CIN II and CIN II/III on ECC.  Final hysterectomy pathology - negative for cervical dysplasia. MMG:  n/a Colonoscopy:  n/a BMD:   n/a  Result  n/a TDaP:  2015 HIV: 09/29/16 Negative Screening Labs: discuss today   reports that she has been smoking Cigarettes.  She has a 12.00 pack-year smoking history. She has never used smokeless tobacco. She reports that she drinks about 3.6 oz of alcohol per week . She reports that she does not use drugs.  Past Medical History:  Diagnosis Date  . Abnormal Pap smear of cervix 03-08-15   --normal pap:Pos HR HPV w/#16 Pos genotype--colposcopy ECC at least high grade dysplasia;cannot exclude adenocarcinoma insitu  . Anemia    d/t heavy menstrual cycles  . Anxiety   . Cancer ([place])    cervical pre cancerous cell  . Depression   . Dysmenorrhea   . Nystagmus    her whole life, sees [facility]    Past Surgical History:  Procedure Laterality Date  . ABDOMINAL HYSTERECTOMY  06-22-15   R-TLH w/Bil.Salpingectomy  . CERVICAL CONIZATION W/BX N/A 04/27/2015   Procedure: CONIZATION CERVIX WITH BIOPSY with ECC;  Surgeon: Nunzio Cobbs, MD;   Location: [facility];  Service: Gynecology;  Laterality: N/A;  . CESAREAN SECTION     2009, 2006, and 2004  . CYSTOSCOPY N/A 06/22/2015   Procedure: CYSTOSCOPY;  Surgeon: Nunzio Cobbs, MD;  Location: [facility];  Service: Gynecology;  Laterality: N/A;  . WISDOM TOOTH EXTRACTION      Current Outpatient Prescriptions  Medication Sig Dispense Refill  . ALPRAZolam (XANAX) 0.25 MG tablet Take 0.25 mg by mouth.  0  . BusPIRone HCl (BUSPAR PO) Take by mouth as needed.    . naproxen (NAPROSYN) 500 MG tablet Take 1 tablet (500 mg total) by mouth 2 (two) times daily as needed for mild pain or moderate pain (take with food). 60 tablet 1   No current facility-administered medications for this visit.     Family History  Problem Relation Age of Onset  . Colon cancer Father   . Cancer Sister 21       Dec from MVA--but had hysterectomy age 32 ?CA (pt. states mom told her -- sister dx'd with germ induced ovarian ca)  . Hyperlipidemia Mother   . Cancer Brother        had hysterectomy at age 48  . Diabetes Maternal Grandmother   . Diabetes Paternal Grandfather   . Stroke Maternal Grandfather  ROS:  Pertinent items are noted in HPI.  Otherwise, a comprehensive ROS was negative.  Exam:   BP 104/68 (BP Location: Right Arm, Patient Position: Sitting, Cuff Size: Normal)   Pulse 92   Resp 16   Ht 5' 6.75" (1.695 m)   Wt 173 lb (78.5 kg)   LMP 06/06/2015 (Approximate)   BMI 27.30 kg/m     General appearance: alert, cooperative and appears stated age Head: Normocephalic, without obvious abnormality, atraumatic Neck: no adenopathy, supple, symmetrical, trachea midline and thyroid normal to inspection and palpation Lungs: clear to auscultation bilaterally Breasts: normal appearance, no masses or tenderness, No nipple retraction or dimpling, No nipple discharge or bleeding, No axillary or supraclavicular adenopathy Heart: regular rate and rhythm Abdomen: soft, non-tender; no masses, no  organomegaly Extremities: extremities normal, atraumatic, no cyanosis or edema Skin: Skin color, texture, turgor normal. No rashes or lesions Lymph nodes: Cervical, supraclavicular, and axillary nodes normal. No abnormal inguinal nodes palpated Neurologic: Grossly normal  Pelvic: External genitalia:  no lesions              Urethra:  normal appearing urethra with no masses, tenderness or lesions              Bartholins and Skenes: normal                 Vagina: normal appearing vagina with normal color and discharge, no lesions              Cervix: absent.              Pap taken: Yes.   Bimanual Exam:  Uterus:   Absent.              Adnexa: no mass, fullness, tenderness            Chaperone was present for exam.  Assessment:   Well woman visit with normal exam. Anxiety. Potential neuropathy. The following is taken from my note on 03/16/16: Status post robotic total laparoscopic hysterectomy with bilateral salpingectomy.  Final pathology negative.  Preprocedure pathology - cervical adenocarcinoma in situ, CIN II and III. Smoker.  Plan: Mammogram screening discussed. Recommended self breast awareness. Pap and HR HPV as above. Guidelines for Calcium, Vitamin D, regular exercise program including cardiovascular and weight bearing exercise. We discussed the benefits of a neurology consultation to diagnose and treat potential neuropathy. Follow up annually and prn.    After visit summary provided.

## 2017-03-21 LAB — CYTOLOGY - PAP
Diagnosis: NEGATIVE
HPV: NOT DETECTED

## 2017-04-16 ENCOUNTER — Ambulatory Visit: Payer: BLUE CROSS/BLUE SHIELD | Admitting: Osteopathic Medicine

## 2017-05-06 ENCOUNTER — Encounter: Payer: Self-pay | Admitting: Osteopathic Medicine

## 2017-06-18 DIAGNOSIS — H5501 Congenital nystagmus: Secondary | ICD-10-CM | POA: Insufficient documentation

## 2017-06-18 DIAGNOSIS — R200 Anesthesia of skin: Secondary | ICD-10-CM | POA: Diagnosis not present

## 2017-06-18 DIAGNOSIS — R202 Paresthesia of skin: Secondary | ICD-10-CM | POA: Diagnosis not present

## 2017-06-18 HISTORY — DX: Congenital nystagmus: H55.01

## 2017-06-20 DIAGNOSIS — R200 Anesthesia of skin: Secondary | ICD-10-CM | POA: Diagnosis not present

## 2017-06-20 DIAGNOSIS — R202 Paresthesia of skin: Secondary | ICD-10-CM | POA: Diagnosis not present

## 2017-06-21 LAB — VITAMIN B12: VITAMIN B 12: 605

## 2017-11-19 ENCOUNTER — Encounter: Payer: Self-pay | Admitting: *Deleted

## 2017-11-19 ENCOUNTER — Emergency Department
Admission: EM | Admit: 2017-11-19 | Discharge: 2017-11-19 | Disposition: A | Discharge status: Home / Self Care | Payer: BLUE CROSS/BLUE SHIELD | Source: Home / Self Care

## 2017-11-19 ENCOUNTER — Emergency Department (INDEPENDENT_AMBULATORY_CARE_PROVIDER_SITE_OTHER): Payer: BLUE CROSS/BLUE SHIELD

## 2017-11-19 ENCOUNTER — Other Ambulatory Visit: Payer: Self-pay

## 2017-11-19 DIAGNOSIS — K5909 Other constipation: Secondary | ICD-10-CM

## 2017-11-19 DIAGNOSIS — F17200 Nicotine dependence, unspecified, uncomplicated: Secondary | ICD-10-CM | POA: Diagnosis not present

## 2017-11-19 DIAGNOSIS — K5900 Constipation, unspecified: Secondary | ICD-10-CM

## 2017-11-19 DIAGNOSIS — R1012 Left upper quadrant pain: Secondary | ICD-10-CM | POA: Diagnosis not present

## 2017-11-19 DIAGNOSIS — K639 Disease of intestine, unspecified: Secondary | ICD-10-CM

## 2017-11-19 LAB — POCT URINALYSIS DIP (MANUAL ENTRY)
Bilirubin, UA: NEGATIVE
Glucose, UA: NEGATIVE mg/dL
Ketones, POC UA: NEGATIVE mg/dL
Leukocytes, UA: NEGATIVE
Nitrite, UA: NEGATIVE
PROTEIN UA: NEGATIVE mg/dL
RBC UA: NEGATIVE
SPEC GRAV UA: 1.015 (ref 1.010–1.025)
UROBILINOGEN UA: 0.2 U/dL
pH, UA: 7.5 (ref 5.0–8.0)

## 2017-11-19 LAB — POCT CBC W AUTO DIFF (K'VILLE URGENT CARE)

## 2017-11-19 NOTE — Discharge Instructions (Addendum)
Always drink lots of fluids.  Continue to get regular exercise by walking  Focus on eating plenty of fruits and vegetables and getting fiber in your diet.  Take MiraLAX regularly as discussed  In the event of sudden acute severe abdominal pain get rechecked or go to emergency room  If chronic abdominal pain persists over the next couple of months of trying to keep your bowels moving regularly, you probably should go ahead and see a gastroenterologist to get your colonoscopy (given your family history).  I urged you to pick a target date to stop smoking as discussed.

## 2017-11-19 NOTE — ED Provider Notes (Addendum)
[facility]    CSN: [id number] Arrival date & time: 11/19/17  0855     History   Chief Complaint Chief Complaint  Patient presents with  . Abdominal Pain  . Numbness    left hand    HPI Tara Velazquez is a 37 y.o. female.   HPI Patient has been having almost daily left upper quadrant abdominal pain for the past 4 months.  She has no other major associated symptoms.  She gets intermittently constipated some.  She has no nausea or vomiting.  There is a family history of her father having had colon cancer in his 69s.  She does not know any other contributing factor.  She does not really take anything to make it go away, it just subsides with a little time.  She also had an episode yesterday of numbness in her left hand that lasted for a while.  She had recently undergone a workup for numbness in her legs and the neurologist did not find anything, called in a nonspecific neuropathy.  She is not having the symptoms today.  I advised her that probably the best thing would be if this keeps happening to go back to the neurologist for further evaluation.  However she probably just pinched or irritated a nerve yesterday.  She has had 3 cesarean sections and a hysterectomy. Past Medical History:  Diagnosis Date  . Abnormal Pap smear of cervix 03-08-15   --normal pap:Pos HR HPV w/#16 Pos genotype--colposcopy ECC at least high grade dysplasia;cannot exclude adenocarcinoma insitu  . Anemia    d/t heavy menstrual cycles  . Anxiety   . Cancer ([place])    cervical pre cancerous cell  . Depression   . Dysmenorrhea   . Nystagmus    her whole life, sees [facility]    Patient Active Problem List   Diagnosis Date Noted  . Chest pain 10/04/2016  . Right-sided chest wall pain 10/04/2016  . Anxiety and depression 10/04/2016  . History of bipolar disorder 10/04/2016  . Fatigue 10/04/2016  . Agoraphobia 10/04/2016  . Status post laparoscopic hysterectomy 06/22/2015  . Elevated LFTs  06/23/2014  . Fibrocystic breast 06/23/2014  . GERD (gastroesophageal reflux disease) 06/23/2014    Past Surgical History:  Procedure Laterality Date  . ABDOMINAL HYSTERECTOMY  06-22-15   R-TLH w/Bil.Salpingectomy  . CERVICAL CONIZATION W/BX N/A 04/27/2015   Procedure: CONIZATION CERVIX WITH BIOPSY with ECC;  Surgeon: Nunzio Cobbs, MD;  Location: [facility];  Service: Gynecology;  Laterality: N/A;  . CESAREAN SECTION     2009, 2006, and 2004  . CYSTOSCOPY N/A 06/22/2015   Procedure: CYSTOSCOPY;  Surgeon: Nunzio Cobbs, MD;  Location: [facility];  Service: Gynecology;  Laterality: N/A;  . WISDOM TOOTH EXTRACTION      OB History    Gravida  3   Para  3   Term  3   Preterm      AB      Living  3     SAB      TAB      Ectopic      Multiple      Live Births               Home Medications    Prior to Admission medications   Medication Sig Start Date End Date Taking? Authorizing Provider  ALPRAZolam (XANAX) 0.25 MG tablet Take 0.25 mg by mouth. 08/23/16   [provider]  BusPIRone HCl (BUSPAR PO) Take by mouth as needed.    [provider]    Family History Family History  Problem Relation Age of Onset  . Colon cancer Father   . Cancer Sister 70       Dec from MVA--but had hysterectomy age 7 ?CA (pt. states mom told her -- sister dx'd with germ induced ovarian ca)  . Hyperlipidemia Mother   . Cancer Brother        had hysterectomy at age 28  . Diabetes Maternal Grandmother   . Diabetes Paternal Grandfather   . Stroke Maternal Grandfather     Social History Social History   Tobacco Use  . Smoking status: Current Every Day Smoker    Packs/day: 1.00    Years: 12.00    Pack years: 12.00    Types: Cigarettes  . Smokeless tobacco: Never Used  Substance Use Topics  . Alcohol use: Yes    Alcohol/week: 3.6 oz    Types: 1 Glasses of wine, 1 Cans of beer, 1 Shots of liquor, 3 Standard drinks or equivalent per week     Comment: once every couple of weeks   . Drug use: No     Allergies   Amoxicillin and Coconut flavor   Review of Systems Review of Systems Constitutional: Unremarkable HEENT unremarkable Cardiovascular: Gets regular exercise, walks a lot, about 10,000 steps a day.  She does smoke and plans to quit some time. Respiratory: Unremarkable Gastrointestinal: No nausea or vomiting.  Occasional constipation.  Almost daily nagging discomfort in the left upper quadrant.  No blood in stools GU: No dysuria or hematuria Musculoskeletal: Unremarkable Neurologic: As above  Physical Exam Triage Vital Signs ED Triage Vitals [11/19/17 0953]  Enc Vitals Group     BP 114/75     Pulse Rate 80     Resp      Temp      Temp src      SpO2 99 %     Weight 185 lb (83.9 kg)     Height      Head Circumference      Peak Flow      Pain Score 2     Pain Loc      Pain Edu?      Excl. in [place]?    No data found.  Updated Vital Signs BP 114/75 (BP Location: Right Arm)   Pulse 80   Wt 185 lb (83.9 kg)   LMP 06/06/2015 (Approximate)   SpO2 99%   BMI 29.19 kg/m   Visual Acuity Right Eye Distance:   Left Eye Distance:   Bilateral Distance:    Right Eye Near:   Left Eye Near:    Bilateral Near:     Physical Exam Ple[place] lady, mildly overweight, no major acute distress.  TMs normal.  Throat clear.  Neck supple without nodes.  Chest clear.  Heart regular without murmur.  Abdomen has normal bowel sounds.  Soft without organomegaly or masses.  No major tenderness in the left upper quadrant.  No CVA tenderness.  UC Treatments / Results  Labs (all labs ordered are listed, but only abnormal results are displayed) Labs Reviewed - No data to display  EKG None Radiology No results found.  Procedures Procedures (including critical care time)  Medications Ordered in UC Medications - No data to display   Initial Impression / Assessment and Plan / UC Course  I have reviewed the triage vital  signs and the nursing  notes.  Pertinent labs & imaging results that were available during my care of the patient were reviewed by me and considered in my medical decision making (see chart for details).     Suspect splenic flexure syndrome type symptoms from chronic low-grade constipation.  Will get x-ray of her abdomen and CBC and urine and then discussed. Discussed findings with patient.  She is quite anxious.  See instructions below.  Labs were good, but family history will necessitate further evaluation if she keeps having symptoms.  Also strongly urged her to set a quit date for smoking. Final Clinical Impressions(s) / UC Diagnoses   Final diagnoses:  None    ED Discharge Orders    None    Always drink lots of fluids.  Continue to get regular exercise by walking  Focus on eating plenty of fruits and vegetables and getting fiber in your diet.  Take MiraLAX regularly as discussed  In the event of sudden acute severe abdominal pain get rechecked or go to emergency room  If chronic abdominal pain persists over the next couple of months of trying to keep your bowels moving regularly, you probably should go ahead and see a gastroenterologist to get your colonoscopy (given your family history).  I urged you to pick a target date to stop smoking as discussed.    Controlled Substance Prescriptions [place] Controlled Substance Registry consulted? No   Posey Boyer, MD 11/19/17 1140    Posey Boyer, MD 11/19/17 [phone]

## 2017-11-19 NOTE — ED Triage Notes (Signed)
Patient c/o LUQ pain x 3-4 months. C/o h/o nausea but thinks its unrelated. Pain is almost daily. Yesterday developed pain in her left hand. She has a h/o pain and numbness in both feet without diagnosis of neuropathy.

## 2018-01-24 ENCOUNTER — Emergency Department (INDEPENDENT_AMBULATORY_CARE_PROVIDER_SITE_OTHER): Payer: BLUE CROSS/BLUE SHIELD

## 2018-01-24 ENCOUNTER — Encounter: Payer: Self-pay | Admitting: *Deleted

## 2018-01-24 ENCOUNTER — Other Ambulatory Visit: Payer: Self-pay

## 2018-01-24 ENCOUNTER — Emergency Department (INDEPENDENT_AMBULATORY_CARE_PROVIDER_SITE_OTHER)
Admission: EM | Admit: 2018-01-24 | Discharge: 2018-01-24 | Disposition: A | Discharge status: Home / Self Care | Payer: BLUE CROSS/BLUE SHIELD | Source: Home / Self Care | Attending: Family Medicine | Admitting: Family Medicine

## 2018-01-24 DIAGNOSIS — M542 Cervicalgia: Secondary | ICD-10-CM | POA: Diagnosis not present

## 2018-01-24 DIAGNOSIS — M4182 Other forms of scoliosis, cervical region: Secondary | ICD-10-CM

## 2018-01-24 DIAGNOSIS — M419 Scoliosis, unspecified: Secondary | ICD-10-CM

## 2018-01-24 DIAGNOSIS — M5412 Radiculopathy, cervical region: Secondary | ICD-10-CM

## 2018-01-24 MED ORDER — CYCLOBENZAPRINE HCL 10 MG PO TABS: 10.0000 mg | ORAL_TABLET | Freq: Two times a day (BID) | ORAL | 0 refills | Status: DC | PRN

## 2018-01-24 MED ORDER — PREDNISONE 20 MG PO TABS: ORAL_TABLET | ORAL | 0 refills | Status: DC

## 2018-01-24 NOTE — ED Triage Notes (Signed)
Pt c/o neck pain x 2 wks with tingling in all extremities and HA. Nausea at times due to pain.

## 2018-01-24 NOTE — Discharge Instructions (Signed)
°  Flexeril (cyclobenzaprine) is a muscle relaxer and may cause drowsiness. Do not drink alcohol, drive, or operate heavy machinery while taking.  You may take 500mg  acetaminophen every 4-6 hours or in combination with ibuprofen 400-600mg  every 6-8 hours as needed for pain.  Please follow up with Sports Medicine in 1 week if not improving. They may decide to refer you to physical therapy and/or order more imaging such as an MRI.

## 2018-01-24 NOTE — ED Provider Notes (Signed)
[facility]    CSN: [id number] Arrival date & time: 01/24/18  1101     History   Chief Complaint Chief Complaint  Patient presents with  . Neck Pain    HPI Tara Velazquez is a 37 y.o. female.   HPI  Tara Velazquez is a 37 y.o. female presenting to UC with c/o neck pain for about 2 weeks that has been waxing and waning. Pain gradually worsens throughout the day. Denies known injury.  Pain is aching and sore, occasionally causes her whole body to ache and become tingling down into her arms and legs with a generalized HA.  Occasional nausea from the pain. She has tried Tylenol and Motrin w/o relief.  Pain is minimal at this time but she is concerned it is going to worsen this evening like it has the last 2 weeks. She has changed her pillows and flipped her mattress. She has tried ice and heat but no relief.  No hx of spinal surgeries or prior neck problems.   Past Medical History:  Diagnosis Date  . Abnormal Pap smear of cervix 03-08-15   --normal pap:Pos HR HPV w/#16 Pos genotype--colposcopy ECC at least high grade dysplasia;cannot exclude adenocarcinoma insitu  . Anemia    d/t heavy menstrual cycles  . Anxiety   . Cancer ([place])    cervical pre cancerous cell  . Depression   . Dysmenorrhea   . Nystagmus    her whole life, sees [facility]    Patient Active Problem List   Diagnosis Date Noted  . Chest pain 10/04/2016  . Right-sided chest wall pain 10/04/2016  . Anxiety and depression 10/04/2016  . History of bipolar disorder 10/04/2016  . Fatigue 10/04/2016  . Agoraphobia 10/04/2016  . Status post laparoscopic hysterectomy 06/22/2015  . Elevated LFTs 06/23/2014  . Fibrocystic breast 06/23/2014  . GERD (gastroesophageal reflux disease) 06/23/2014    Past Surgical History:  Procedure Laterality Date  . ABDOMINAL HYSTERECTOMY  06-22-15   R-TLH w/Bil.Salpingectomy  . CERVICAL CONIZATION W/BX N/A 04/27/2015   Procedure: CONIZATION CERVIX WITH BIOPSY with ECC;   Surgeon: Nunzio Cobbs, MD;  Location: [facility];  Service: Gynecology;  Laterality: N/A;  . CESAREAN SECTION     2009, 2006, and 2004  . CYSTOSCOPY N/A 06/22/2015   Procedure: CYSTOSCOPY;  Surgeon: Nunzio Cobbs, MD;  Location: [facility];  Service: Gynecology;  Laterality: N/A;  . WISDOM TOOTH EXTRACTION      OB History    Gravida  3   Para  3   Term  3   Preterm      AB      Living  3     SAB      TAB      Ectopic      Multiple      Live Births               Home Medications    Prior to Admission medications   Medication Sig Start Date End Date Taking? Authorizing Provider  ALPRAZolam (XANAX) 0.25 MG tablet Take 0.25 mg by mouth. 08/23/16   [provider]  BusPIRone HCl (BUSPAR PO) Take by mouth as needed.    [provider]    Family History Family History  Problem Relation Age of Onset  . Colon cancer Father   . Cancer Sister 69       Dec from MVA--but had hysterectomy age 70 ?[place] (pt. states  mom told her -- sister dx'd with germ induced ovarian ca)  . Hyperlipidemia Mother   . Cancer Brother        had hysterectomy at age 41  . Diabetes Maternal Grandmother   . Diabetes Paternal Grandfather   . Stroke Maternal Grandfather     Social History Social History   Tobacco Use  . Smoking status: Current Every Day Smoker    Packs/day: 1.00    Years: 12.00    Pack years: 12.00    Types: Cigarettes  . Smokeless tobacco: Never Used  Substance Use Topics  . Alcohol use: Yes    Alcohol/week: 3.6 oz    Types: 1 Glasses of wine, 1 Cans of beer, 1 Shots of liquor, 3 Standard drinks or equivalent per week    Comment: once every couple of weeks   . Drug use: No     Allergies   Amoxicillin and Coconut flavor   Review of Systems Review of Systems  Constitutional: Negative for chills and fever.  Musculoskeletal: Positive for arthralgias, back pain, myalgias, neck pain and neck stiffness.  Skin: Negative for color  change and rash.  Neurological: Positive for numbness. Negative for weakness.     Physical Exam Triage Vital Signs ED Triage Vitals  Enc Vitals Group     BP 01/24/18 1117 118/77     Pulse Rate 01/24/18 1117 85     Resp 01/24/18 1117 18     Temp 01/24/18 1117 98.5 F (36.9 C)     Temp Source 01/24/18 1117 Oral     SpO2 01/24/18 1117 98 %     Weight 01/24/18 1119 187 lb (84.8 kg)     Height 01/24/18 1119 5\' 7"  (1.702 m)     Head Circumference --      Peak Flow --      Pain Score 01/24/18 1119 3     Pain Loc --      Pain Edu? --      Excl. in [place]? --    No data found.  Updated Vital Signs BP 118/77 (BP Location: Right Arm)   Pulse 85   Temp 98.5 F (36.9 C) (Oral)   Resp 18   Ht 5\' 7"  (1.702 m)   Wt 187 lb (84.8 kg)   LMP 06/06/2015 (Approximate)   SpO2 98%   BMI 29.29 kg/m   Visual Acuity Right Eye Distance:   Left Eye Distance:   Bilateral Distance:    Right Eye Near:   Left Eye Near:    Bilateral Near:     Physical Exam  Constitutional: She is oriented to person, place, and time. She appears well-developed and well-nourished. No distress.  HENT:  Head: Normocephalic and atraumatic.  Mouth/Throat: Oropharynx is clear and moist.  Eyes: EOM are normal.  nystagmus noted (chronic)  Neck: Normal range of motion. Neck supple.  No midline spinal tenderness. Full ROM neck. No muscular tenderness.  Cardiovascular: Normal rate and regular rhythm.  Pulmonary/Chest: Effort normal and breath sounds normal. No respiratory distress.  Musculoskeletal: Normal range of motion.  Full ROM upper and lower extremities with 5/5 strength. Normal gait.   Neurological: She is alert and oriented to person, place, and time.  Skin: Skin is warm and dry. She is not diaphoretic.  Psychiatric: She has a normal mood and affect. Her behavior is normal.  Nursing note and vitals reviewed.    UC Treatments / Results  Labs (all labs ordered are listed, but only abnormal results  are  displayed) Labs Reviewed - No data to display  EKG None  Radiology Dg Cervical Spine Complete  Result Date: 01/24/2018 CLINICAL DATA:  Cervicalgia EXAM: CERVICAL SPINE - COMPLETE 4+ VIEW COMPARISON:  None. FINDINGS: Frontal, lateral, open-mouth odontoid, and bilateral oblique views were obtained. There is thoracolumbar levoscoliosis. There is no fracture or spondylolisthesis. Prevertebral soft tissues and predental space regions are normal. The disc spaces appear normal. There is no appreciable exit foraminal narrowing on the oblique views. There are rudimentary cervical ribs bilaterally. Lung apices are clear. IMPRESSION: Scoliosis. No fracture or spondylolisthesis. No appreciable arthropathic change. Rudimentary cervical ribs noted. Electronically Signed   By: Lowella Grip III M.D.   On: 01/24/2018 11:39    Procedures Procedures (including critical care time)  Medications Ordered in UC Medications - No data to display  Initial Impression / Assessment and Plan / UC Course  I have reviewed the triage vital signs and the nursing notes.  Pertinent labs & imaging results that were available during my care of the patient were reviewed by me and considered in my medical decision making (see chart for details).     Cervical scoliosis noted on plain films. Likely contributing to reported symptoms.  No evidence of emergent process taking place. Home care instructions provided below.   Final Clinical Impressions(s) / UC Diagnoses   Final diagnoses:  Neck pain   Discharge Instructions   None    ED Prescriptions    None     Controlled Substance Prescriptions [place] Controlled Substance Registry consulted? Not Applicable   Tyrell Antonio 01/24/18 1248

## 2018-02-04 ENCOUNTER — Encounter: Payer: Self-pay | Admitting: Family Medicine

## 2018-02-04 ENCOUNTER — Ambulatory Visit (INDEPENDENT_AMBULATORY_CARE_PROVIDER_SITE_OTHER): Payer: BLUE CROSS/BLUE SHIELD | Admitting: Family Medicine

## 2018-02-04 VITALS — BP 108/77 | HR 83 | Ht 67.0 in | Wt 186.0 lb

## 2018-02-04 DIAGNOSIS — M4122 Other idiopathic scoliosis, cervical region: Secondary | ICD-10-CM

## 2018-02-04 DIAGNOSIS — M419 Scoliosis, unspecified: Secondary | ICD-10-CM

## 2018-02-04 DIAGNOSIS — M5416 Radiculopathy, lumbar region: Secondary | ICD-10-CM | POA: Diagnosis not present

## 2018-02-04 DIAGNOSIS — M542 Cervicalgia: Secondary | ICD-10-CM | POA: Diagnosis not present

## 2018-02-04 HISTORY — DX: Scoliosis, unspecified: M41.9

## 2018-02-04 NOTE — Patient Instructions (Addendum)
Thank you for coming in today. Next step for neck pain is PT if not doing well.  Let me know and I will order it.  Next step for leg numbness is a MRI of your lumbar spine.  We will order the MRI and you should hear about that soon.    Recheck with me a few days after the MRI to go over results.

## 2018-02-04 NOTE — Progress Notes (Signed)
Subjective:    I'm seeing this patient as a consultation for:  Tara Cha PA-C, and CC: Tara Reeve, DO   CC: Neck pain and discuss persistent lower extremity paresthesias   HPI:  Tara Velazquez notes a several week history of neck pain.  She denies any injury.  She was seen by urgent care provider on June 6 after experiencing severe neck pain.  X-ray at the time did not show any acute findings but did note some mild cervical scoliosis.  She notes pain is worse with activity and better with rest.  She was given a trial of prednisone and cyclobenzaprine.  She notes this is helped a lot.  She rates her pain currently at about a 2 out of 10 and feels a lot better.  She denies any radiating pain weakness or numbness into her upper extremities.  She notes that she has a history of bilateral lower extremity pain numbness and tingling.  She had an evaluation with neurology last year at [facility] with reportedly normal nerve conduction study as well as normal laboratory assessment that will be attached below.  She notes symptoms are bilateral and typically predominantly radiate down her legs to her big toe.  She notes symptoms are intermittent but can be quite severe at times.  In the past she has had trouble tolerating gabapentin.  She is not currently on any medications for this.  She rates her lower extremity symptoms as moderate.  Past medical history, Surgical history, Family history not pertinant except as noted below, Social history, Allergies, and medications have been entered into the medical record, reviewed, and no changes needed.   Review of Systems: No headache, visual changes, nausea, vomiting, diarrhea, constipation, dizziness, abdominal pain, skin rash, fevers, chills, night sweats, weight loss, swollen lymph nodes, body aches, joint swelling, muscle aches, chest pain, shortness of breath, mood changes, visual or auditory hallucinations.   Objective:    Vitals:   02/04/18 1023  BP:  108/77  Pulse: 83   General: Well Developed, well nourished, and in no acute distress.  Neuro/Psych: Alert and oriented x3, extra-ocular muscles intact, able to move all 4 extremities, sensation grossly intact.  Persistent horizontal nystagmus present Skin: Warm and dry, no rashes noted.  Respiratory: Not using accessory muscles, speaking in full sentences, trachea midline.  Cardiovascular: Pulses palpable, no extremity edema. Abdomen: Does not appear distended. MSK:  C-spine: Nontender to midline.  Tender to palpation right-sided cervical paraspinal muscle group.  Normal motion. Negative Spurling's test bilaterally. Upper extremity strength reflexes and sensation are equal normal throughout.  Lumbar spine: Nontender to midline. Normal low back motion. Negative slump test bilaterally. Lower extreme strength reflexes and sensation are intact.    Lab and Radiology Results EXAM: CERVICAL SPINE - COMPLETE 4+ VIEW  COMPARISON:  None.  FINDINGS: Frontal, lateral, open-mouth odontoid, and bilateral oblique views were obtained. There is thoracolumbar levoscoliosis. There is no fracture or spondylolisthesis. Prevertebral soft tissues and predental space regions are normal. The disc spaces appear normal. There is no appreciable exit foraminal narrowing on the oblique views. There are rudimentary cervical ribs bilaterally. Lung apices are clear.  IMPRESSION: Scoliosis. No fracture or spondylolisthesis. No appreciable arthropathic change. Rudimentary cervical ribs noted.   Electronically Signed   By: Lowella Grip III M.D.   On: 01/24/2018 11:39 I personally (independently) visualized and performed the interpretation of the images attached in this note.  Abdominal x-ray images from April 2019 reviewed showing mild scoliosis of the lumbar spine with  no acute changes.  Impression and Recommendations:    Assessment and Plan: 37 y.o. female with  Neck pain: Likely  cervical spine spasm improved today but not fully improved.  Discussed treatment options.  Plan for continued home exercise program and watchful waiting.  If symptoms do not improve next step will be physical therapy.  Patient will let me know if she would like a referral.  Bilateral lower extremity radicular pain/paresthesias with negative initial work-up.  Symptoms are possibly due to lumbar radicular pain.  Her pain could be in the L4 or S1 nerve dermatome.  Plan for MRI as patient has had symptoms greater than 6 months and has failed conservative management and had a negative initial work-up.  Recheck after MRI.  Will obtain medical records from nerve conduction study.   Orders Placed This Encounter  Procedures  . MR Lumbar Spine Wo Contrast    Standing Status:   Future    Standing Expiration Date:   04/07/2019    Order Specific Question:   What is the patient's sedation requirement?    Answer:   No Sedation    Order Specific Question:   Does the patient have a pacemaker or implanted devices?    Answer:   No    Order Specific Question:   Preferred imaging location?    Answer:   Product/process development scientist (table limit-350lbs)    Order Specific Question:   Radiology Contrast Protocol - do NOT remove file path    Answer:   \\[email or web address]   No orders of the defined types were placed in this encounter.   Discussed warning signs or symptoms. Please see discharge instructions. Patient expresses understanding.   Care Everywhere Result Report Labs reviewed   C-Reactive ProteinResulted: 06/21/2017 5:40 AM [facility] Component Name Value Ref Range  CRP <0.3 0 - 4.9 mg/L  Specimen Collected on  Blood 06/18/2017 10:40 AM  Result Narrative  Performed at:  [facility] [address] [place], [place]  [id number] Lab Director: Lindon Romp MD, Phone:  [phone]   Care Everywhere Result Report ANA w/Reflex if PositiveResulted: 06/21/2017 5:40  AM [facility] Component Name Value Ref Range  ANA Direct Negative Negative   Specimen Collected on  Blood 06/18/2017 10:40 AM  Result Narrative  Performed at:  [facility] [address] [place], [place]  [id number] Lab Director: Lindon Romp MD, Phone:  [phone]   Care Everywhere Result Report Immunofixation ElectrophoresisResulted: 06/21/2017 5:40 AM [facility] Component Name Value Ref Range  Total IgG 862 700 - 1,600 mg/dL  IgA 113 87 - 352 mg/dL  IgM 110 26 - 217 mg/dL  Total Protein 6.5 6 - 8.5 g/dL  ALBUMIN 3.8 2.9 - 4.4 g/dL  Alpha-1-Globulin 0.2 0 - 0.4 g/dL  Alpha-2-Globulin 0.6 0.4 - 1 g/dL  Beta Globulin 0.8 0.7 - 1.3 g/dL  GAMMA GLOBULIN 1.0 0.4 - 1.8 g/dL  M-Spike Not Observed Not Observed g/dL  Globulin, Total 2.7 2.2 - 3.9 g/dL  Albumin/Globulin Ratio 1.5 0.7 - 1.7   Immunofixation Result, Serum Comment  Comment: An apparent normal immunofixation pattern.    SPE Note Comment  Comment: Protein electrophoresis scan will follow via computer, mail, or courier delivery.    Specimen Collected on  Blood 06/18/2017 10:40 AM  Result Narrative  Performed at:  [address], [place], [place]  [id number] Lab Director: Lindon Romp MD, Phone:  [phone]   Care Everywhere Result Report Methylmalonic Acid,  SerumResulted: 06/21/2017 5:40 AM [facility] Component Name Value Ref Range  Methylmalonic Acid, Serum 187 0 - 378 nmol/L  Disclaimer: Comment  Comment: This test was developed and its performance characteristics determined by [facility]. It has not been cleared or approved by the Food and Drug Administration.    Specimen Collected on  Blood 06/18/2017 10:40 AM  Result Narrative  Performed at:  [address], [place], [place]  [id number] Lab Director: Lindon Romp MD, Phone:  [phone]   Care Everywhere Result Report Vitamin B12 and FolateResulted: 06/21/2017 5:40 AM [facility] Component Name Value Ref Range  Vitamin B-12 605 232 - 1,245 pg/mL  Folate 12.3  Comment: A serum folate concentration of less than 3.1 ng/mL is considered to represent clinical deficiency. >3.0 ng/mL  Specimen Collected on  Blood 06/18/2017 10:40 AM  Result Narrative  Performed at:  [address], [place], [place]  [id number] Lab Director: Lindon Romp MD, Phone:  [phone]   Care Everywhere Result Report RPRResulted: 06/21/2017 5:40 AM [facility] Component Name Value Ref Range  RPR Non Reactive Non Reactive   Specimen Collected on  Blood 06/18/2017 10:40 AM  Result Narrative  Performed at:  [address], [place], [place]  [id number] Lab Director: Lindon Romp MD, Phone:  [phone]

## 2018-02-11 ENCOUNTER — Ambulatory Visit (INDEPENDENT_AMBULATORY_CARE_PROVIDER_SITE_OTHER): Payer: BLUE CROSS/BLUE SHIELD

## 2018-02-11 DIAGNOSIS — R200 Anesthesia of skin: Secondary | ICD-10-CM

## 2018-02-11 DIAGNOSIS — M5127 Other intervertebral disc displacement, lumbosacral region: Secondary | ICD-10-CM | POA: Diagnosis not present

## 2018-02-11 DIAGNOSIS — M545 Low back pain: Secondary | ICD-10-CM | POA: Diagnosis not present

## 2018-02-11 DIAGNOSIS — M5126 Other intervertebral disc displacement, lumbar region: Secondary | ICD-10-CM | POA: Diagnosis not present

## 2018-02-11 DIAGNOSIS — M5416 Radiculopathy, lumbar region: Secondary | ICD-10-CM

## 2018-02-13 ENCOUNTER — Encounter: Payer: Self-pay | Admitting: Osteopathic Medicine

## 2018-02-13 LAB — IMMUNOFIXATION ELECTROPHORESIS
ALPHA 1 GLOBULIN: 0.2
ALPHA 2: 0.6
Albumin: 3.8
BETA GLOBULIN: 0.8
Gamma Globulin: 1
IgA: 113
IgG: 862
IgM (loc): 110
Total Protein: 6.5

## 2018-02-13 LAB — FOLATE: Folate: 12.3

## 2018-02-13 LAB — ANA: ANA DIRECT: NEGATIVE

## 2018-02-13 LAB — RPR: RPR: NONREACTIVE

## 2018-02-13 LAB — C-REACTIVE PROTEIN

## 2018-02-13 LAB — METHYLMALONIC ACID, SERUM: METHYLMALONIC ACID: 187

## 2018-02-19 ENCOUNTER — Encounter: Payer: Self-pay | Admitting: Family Medicine

## 2018-02-19 ENCOUNTER — Ambulatory Visit (INDEPENDENT_AMBULATORY_CARE_PROVIDER_SITE_OTHER): Payer: BLUE CROSS/BLUE SHIELD | Admitting: Family Medicine

## 2018-02-19 VITALS — BP 103/72 | HR 75 | Ht 67.0 in | Wt 184.0 lb

## 2018-02-19 DIAGNOSIS — F329 Major depressive disorder, single episode, unspecified: Secondary | ICD-10-CM

## 2018-02-19 DIAGNOSIS — Z8659 Personal history of other mental and behavioral disorders: Secondary | ICD-10-CM | POA: Diagnosis not present

## 2018-02-19 DIAGNOSIS — R202 Paresthesia of skin: Secondary | ICD-10-CM | POA: Diagnosis not present

## 2018-02-19 DIAGNOSIS — F419 Anxiety disorder, unspecified: Secondary | ICD-10-CM

## 2018-02-19 DIAGNOSIS — F32A Depression, unspecified: Secondary | ICD-10-CM

## 2018-02-19 DIAGNOSIS — F319 Bipolar disorder, unspecified: Secondary | ICD-10-CM

## 2018-02-19 NOTE — Patient Instructions (Signed)
Thank you for coming in today. Recheck with me as needed.  Consider following with psychiatry.  We could consider Cspine and then Brain and Tspine MRI to eval for MS in the future if needed.   Consider Abilify with psychiatry.

## 2018-02-19 NOTE — Progress Notes (Signed)
Tara Velazquez is a 37 y.o. female who presents to [facility] today for  Follow-up neck pain and leg pain.  Tara Velazquez was seen on June 17 for neck pain and for leg pain thought to be due to lumbar radicular symptoms.  She had an MRI of her lumbar spine and is here for follow-up.  She notes that her neck and trapezius pain has improved.  She does note that the leg symptoms are intermittent and about stable.  She denies any weakness or numbness or bowel bladder dysfunction.  She does note however that her mood has been not well controlled recently.  She has a self-reported history of either bipolar disorder per her borderline personality disorder.  She is not sure exactly when she notes that she will have episodes of mania or hypomania where she does not sleep well and gets irritable as well as episodes of depression.  She notes in the past she is tried risperidone but found it to be very fatiguing.  She is not currently taking any medication.  Her psychiatrist is in [place] and she was last seen over a year ago per patient report.  She notes mental fogginess due to her mood symptoms.  She is reluctant to take more medicine because she did not tolerate Risperdal very well.  She is stable able to take care of her things at home and function.  She denies any active SI or HI.    ROS:  As above  Exam:  BP 103/72   Pulse 75   Ht 5\' 7"  (1.702 m)   Wt 184 lb (83.5 kg)   LMP 06/06/2015 (Approximate)   BMI 28.82 kg/m  General: Well Developed, well nourished, and in no acute distress.  Neuro/Psych: Alert and oriented x3, extra-ocular muscles intact, able to move all 4 extremities, sensation grossly intact. Skin: Warm and dry, no rashes noted.  Respiratory: Not using accessory muscles, speaking in full sentences, trachea midline.  Cardiovascular: Pulses palpable, no extremity edema. Abdomen: Does not appear distended. MSK:  Spine: Normal neck and back  motion.  Normal gait.  Lower extremity strength is intact. Psych alert and oriented.  Affect is tearful.  Normal speech and thought process.  No SI or HI expressed.  Depression screen [facility] 2/9 02/19/2018 09/29/2016  Decreased Interest 1 1  Down, Depressed, Hopeless 1 0  PHQ - 2 Score 2 1  Altered sleeping 2 1  Tired, decreased energy 3 1  Change in appetite 1 1  Feeling bad or failure about yourself  1 0  Trouble concentrating 3 1  Moving slowly or fidgety/restless 2 0  Suicidal thoughts 1 0  PHQ-9 Score 15 5  Difficult doing work/chores Somewhat difficult -       Lab and Radiology Results  EXAM: MRI LUMBAR SPINE WITHOUT CONTRAST  TECHNIQUE: Multiplanar, multisequence MR imaging of the lumbar spine was performed. No intravenous contrast was administered.  COMPARISON:  None.  FINDINGS: Segmentation:  Standard.  Alignment:  Physiologic.  Vertebrae:  No fracture, evidence of discitis, or bone lesion.  Conus medullaris and cauda equina: Conus extends to the L1 level. Conus and cauda equina appear normal.  Paraspinal and other soft tissues: Negative.  Disc levels:  Disc spaces: Disc heights are maintained.  T12-L1: No significant disc bulge. No evidence of neural foraminal stenosis. No central canal stenosis.  L1-L2: No significant disc bulge. No evidence of neural foraminal stenosis. No central canal stenosis.  L2-L3: No significant disc  bulge. No evidence of neural foraminal stenosis. No central canal stenosis.  L3-L4: No significant disc bulge. No evidence of neural foraminal stenosis. No central canal stenosis.  L4-L5: Minimal broad-based disc bulge. No evidence of neural foraminal stenosis. No central canal stenosis.  L5-S1: Mild broad-based disc bulge. No evidence of neural foraminal stenosis. No central canal stenosis.  IMPRESSION: No significant lumbar spine disc protrusion, foraminal stenosis or central canal stenosis. No acute  osseous injury of the lumbar spine.   Electronically Signed   By: Kathreen Devoid   On: 02/11/2018 08:33 I personally (independently) visualized and performed the interpretation of the images attached in this note.     Assessment and Plan: 37 y.o. female with  Intermittent leg pain.  Patient has a history of negative nerve conduction study and now has an essentially normal lumbar MRI.  It is possible she does have nerve impingement somewhere distally is also possible she has some sort of central nervous system issue like multiple sclerosis.  She does not have any motor component and she is not progressively worsening.  We discussed treatment options and plan for watchful waiting at this point.  Mood disorder: Patient has a history of bipolar disorder and has worsening mood.  We did some motivational interviewing about her options.  I think would be a good idea for her to follow back up with psychiatry.  She is thinking about it.  Additionally recommend recheck with PCP.  At this point I think patient is safe to make medical decisions and is not actively suicidal.  No active SI or HI.  I spent 25 minutes with this patient, greater than 50% was face-to-face time counseling regarding ddx and plan.    Historical information moved to improve visibility of documentation.  Past Medical History:  Diagnosis Date  . Abnormal Pap smear of cervix 03-08-15   --normal pap:Pos HR HPV w/#16 Pos genotype--colposcopy ECC at least high grade dysplasia;cannot exclude adenocarcinoma insitu  . Anemia    d/t heavy menstrual cycles  . Anxiety   . Cancer ([place])    cervical pre cancerous cell  . Cervical scoliosis 02/04/2018  . Congenital nystagmus 06/18/2017  . Depression   . Dysmenorrhea   . GERD (gastroesophageal reflux disease) 06/23/2014  . Nystagmus    her whole life, sees [facility]   Past Surgical History:  Procedure Laterality Date  . ABDOMINAL HYSTERECTOMY  06-22-15   R-TLH  w/Bil.Salpingectomy  . CERVICAL CONIZATION W/BX N/A 04/27/2015   Procedure: CONIZATION CERVIX WITH BIOPSY with ECC;  Surgeon: Nunzio Cobbs, MD;  Location: [facility];  Service: Gynecology;  Laterality: N/A;  . CESAREAN SECTION     2009, 2006, and 2004  . CYSTOSCOPY N/A 06/22/2015   Procedure: CYSTOSCOPY;  Surgeon: Nunzio Cobbs, MD;  Location: [facility];  Service: Gynecology;  Laterality: N/A;  . WISDOM TOOTH EXTRACTION     Social History   Tobacco Use  . Smoking status: Current Every Day Smoker    Packs/day: 1.00    Years: 12.00    Pack years: 12.00    Types: Cigarettes  . Smokeless tobacco: Never Used  Substance Use Topics  . Alcohol use: Yes    Alcohol/week: 3.6 oz    Types: 1 Glasses of wine, 1 Cans of beer, 1 Shots of liquor, 3 Standard drinks or equivalent per week    Comment: once every couple of weeks    family history includes Cancer in her brother; Cancer (  age of onset: 52) in her sister; Colon cancer in her father; Diabetes in her maternal grandmother and paternal grandfather; Hyperlipidemia in her mother; Stroke in her maternal grandfather.  Medications: Current Outpatient Medications  Medication Sig Dispense Refill  . ALPRAZolam (XANAX) 0.25 MG tablet Take 0.25 mg by mouth.  0  . BusPIRone HCl (BUSPAR PO) Take by mouth as needed.    . cyclobenzaprine (FLEXERIL) 10 MG tablet Take 1 tablet (10 mg total) by mouth 2 (two) times daily as needed. 20 tablet 0   No current facility-administered medications for this visit.    Allergies  Allergen Reactions  . Amoxicillin Hives    Not sure if ever had penicillin  . Coconut Flavor Swelling      Discussed warning signs or symptoms. Please see discharge instructions. Patient expresses understanding.

## 2018-03-01 ENCOUNTER — Encounter: Payer: Self-pay | Admitting: Family Medicine

## 2018-03-04 ENCOUNTER — Ambulatory Visit (INDEPENDENT_AMBULATORY_CARE_PROVIDER_SITE_OTHER): Payer: BLUE CROSS/BLUE SHIELD

## 2018-03-04 DIAGNOSIS — M50121 Cervical disc disorder at C4-C5 level with radiculopathy: Secondary | ICD-10-CM

## 2018-03-04 DIAGNOSIS — R202 Paresthesia of skin: Secondary | ICD-10-CM

## 2018-03-04 DIAGNOSIS — M47812 Spondylosis without myelopathy or radiculopathy, cervical region: Secondary | ICD-10-CM | POA: Diagnosis not present

## 2018-03-04 DIAGNOSIS — M50223 Other cervical disc displacement at C6-C7 level: Secondary | ICD-10-CM | POA: Diagnosis not present

## 2018-03-05 ENCOUNTER — Encounter: Payer: Self-pay | Admitting: Family Medicine

## 2018-03-05 DIAGNOSIS — R202 Paresthesia of skin: Secondary | ICD-10-CM

## 2018-03-06 ENCOUNTER — Encounter: Payer: Self-pay | Admitting: Family Medicine

## 2018-03-06 ENCOUNTER — Ambulatory Visit: Payer: BLUE CROSS/BLUE SHIELD | Admitting: Family Medicine

## 2018-03-06 VITALS — BP 107/71 | HR 88 | Temp 98.0°F | Ht 67.0 in | Wt 186.0 lb

## 2018-03-06 DIAGNOSIS — F419 Anxiety disorder, unspecified: Secondary | ICD-10-CM

## 2018-03-06 DIAGNOSIS — R5383 Other fatigue: Secondary | ICD-10-CM | POA: Diagnosis not present

## 2018-03-06 DIAGNOSIS — R202 Paresthesia of skin: Secondary | ICD-10-CM | POA: Diagnosis not present

## 2018-03-06 DIAGNOSIS — M791 Myalgia, unspecified site: Secondary | ICD-10-CM | POA: Diagnosis not present

## 2018-03-06 DIAGNOSIS — M797 Fibromyalgia: Secondary | ICD-10-CM

## 2018-03-06 MED ORDER — PREGABALIN 75 MG PO CAPS: 75.0000 mg | ORAL_CAPSULE | Freq: Two times a day (BID) | ORAL | 3 refills | Status: DC

## 2018-03-06 MED ORDER — DULOXETINE HCL 30 MG PO CPEP: 30.0000 mg | ORAL_CAPSULE | Freq: Every day | ORAL | 1 refills | Status: DC

## 2018-03-06 NOTE — Progress Notes (Signed)
Tara Velazquez is a 37 y.o. female who presents to [facility]: [place] today for diffuse myalgia and paresthesias.  Tara Velazquez has been seen multiple times for paresthesias.  She notes bilateral lower extremities numbness and tingling.  This is been ongoing for months.  She notes however new onset left upper extremity numbness and tingling radiating to her hand on the left side.  She is not sure which exact fingers it is but thinks it is the majority of her fingers in her hand.  She denies any injury or neck pain currently.  She does however note worsening significant diffuse myalgias.  She notes pain in her trapezius neck back arms and legs bilaterally.  This is quite significant and associate with fatigue.  She has had some work-up for this already.  In October November she said was seen by neurology and had a reportedly normal nerve conduction study of her bilateral lower extremities.  Additionally she had some rheumatologic work-up around the same time that was effectively normal.  She has been significantly bothered by her current symptoms and notes that she is unable to take care of things at home or work because of them.  She is had an unremarkable MRI of her cervical spine and lumbar spine recently.   ROS as above:  Exam:  BP 107/71   Pulse 88   Temp 98 F (36.7 C) (Oral)   Ht 5\' 7"  (1.702 m)   Wt 186 lb (84.4 kg)   LMP 06/06/2015 (Approximate)   BMI 29.13 kg/m  Gen: Well NAD HEENT: EOMI,  MMM Lungs: Normal work of breathing. CTABL Heart: RRR no MRG Abd: NABS, Soft. Nondistended, Nontender Exts: Brisk capillary refill, warm and well perfused.  Neuro: Alert and oriented.  Congenital nystagmus present.  Normal coordination reflexes sensation and strength. MSK: Diffusely tender to point tender along multiple muscle groups including trapezius, upper arms, forearms, trunk, and  lower extremities.  Lab and Radiology Results No results found for this or any previous visit (from the past 72 hour(s)). Mr Cervical Spine Wo Contrast  Result Date: 03/04/2018 CLINICAL DATA:  Chronic back pain. Pain and numbness in the right arm. EXAM: MRI CERVICAL SPINE WITHOUT CONTRAST TECHNIQUE: Multiplanar, multisequence MR imaging of the cervical spine was performed. No intravenous contrast was administered. COMPARISON:  Cervical spine radiographs 01/24/2018 FINDINGS: Alignment: Normal. Vertebrae: No fracture, destructive osseous lesion, or evidence of discitis. Suspected atypical hemangioma in the T2 vertebral body. Cord: Normal signal and morphology. Posterior Fossa, vertebral arteries, paraspinal tissues: Unremarkable. Disc levels: C2-3: Mild left facet arthrosis without disc herniation or stenosis. C3-4: Negative. C4-5: Tiny central/left central disc protrusion and minimal left facet arthrosis without stenosis. C5-6: Negative. C6-7: Negative. C7-T1: Minimal facet arthrosis without disc herniation or stenosis. IMPRESSION: Tiny C4-5 disc protrusion and mild multilevel facet arthrosis without evidence of neural impingement. Electronically Signed   By: Logan Bores M.D.   On: 03/04/2018 08:29  I personally (independently) visualized and performed the interpretation of the images attached in this note.    Assessment and Plan: 37 y.o. female with  Paresthesias and myalgias.  Etiology remains unclear.  Patient has had long-standing paresthesias with a partial work-up that so far has been unrevealing.  MS remains a possibility and if the work-up below is unremarkable or unrevealing next step would be brain MRI to help diagnose her paresthesias and help eliminate MS is a possibility.  Patient has tried and not tolerated gabapentin  in the past.  Plan for trial of Lyrica.  Additionally patient has new onset diffuse myalgias.  This is been ongoing for a few weeks and can be quite severe.  Etiology is  unclear.  Proceed with rheumatologic work-up listed below.  Some of these labs have already been done were unremarkable about 8 months ago.  Assuming labs are normal I think the likely diagnosis here is fibromyalgias for the myalgia.  Plan to prescribe Lyrica as well as Cymbalta.  Recheck in 2 weeks.   Orders Placed This Encounter  Procedures  . CBC with Differential/Platelet  . Comprehensive metabolic panel  . Sedimentation rate  . ANA  . CK  . Vitamin B12  . TSH  . B. burgdorfi antibodies   Meds ordered this encounter  Medications  . DISCONTD: pregabalin (LYRICA) 75 MG capsule    Sig: Take 1 capsule (75 mg total) by mouth 2 (two) times daily.    Dispense:  60 capsule    Refill:  3    Tried and failed gabapentin  . DULoxetine (CYMBALTA) 30 MG capsule    Sig: Take 1 capsule (30 mg total) by mouth daily. For mood and pain    Dispense:  30 capsule    Refill:  1  . pregabalin (LYRICA) 75 MG capsule    Sig: Take 1 capsule (75 mg total) by mouth 2 (two) times daily.    Dispense:  60 capsule    Refill:  3    Tried and failed gabapentin     Historical information moved to improve visibility of documentation.  Past Medical History:  Diagnosis Date  . Abnormal Pap smear of cervix 03-08-15   --normal pap:Pos HR HPV w/#16 Pos genotype--colposcopy ECC at least high grade dysplasia;cannot exclude adenocarcinoma insitu  . Anemia    d/t heavy menstrual cycles  . Anxiety   . Cancer ([place])    cervical pre cancerous cell  . Cervical scoliosis 02/04/2018  . Congenital nystagmus 06/18/2017  . Depression   . Dysmenorrhea   . GERD (gastroesophageal reflux disease) 06/23/2014  . Nystagmus    her whole life, sees [facility]   Past Surgical History:  Procedure Laterality Date  . ABDOMINAL HYSTERECTOMY  06-22-15   R-TLH w/Bil.Salpingectomy  . CERVICAL CONIZATION W/BX N/A 04/27/2015   Procedure: CONIZATION CERVIX WITH BIOPSY with ECC;  Surgeon: Nunzio Cobbs, MD;  Location: [facility];  Service: Gynecology;  Laterality: N/A;  . CESAREAN SECTION     2009, 2006, and 2004  . CYSTOSCOPY N/A 06/22/2015   Procedure: CYSTOSCOPY;  Surgeon: Nunzio Cobbs, MD;  Location: [facility];  Service: Gynecology;  Laterality: N/A;  . WISDOM TOOTH EXTRACTION     Social History   Tobacco Use  . Smoking status: Current Every Day Smoker    Packs/day: 1.00    Years: 12.00    Pack years: 12.00    Types: Cigarettes  . Smokeless tobacco: Never Used  Substance Use Topics  . Alcohol use: Yes    Alcohol/week: 3.6 oz    Types: 1 Glasses of wine, 1 Cans of beer, 1 Shots of liquor, 3 Standard drinks or equivalent per week    Comment: once every couple of weeks    family history includes Cancer in her brother; Cancer (age of onset: 40) in her sister; Colon cancer in her father; Diabetes in her maternal grandmother and paternal grandfather; Hyperlipidemia in her mother; Stroke in her maternal grandfather.  Medications:  Current Outpatient Medications  Medication Sig Dispense Refill  . cyclobenzaprine (FLEXERIL) 10 MG tablet Take 1 tablet (10 mg total) by mouth 2 (two) times daily as needed. 20 tablet 0  . DULoxetine (CYMBALTA) 30 MG capsule Take 1 capsule (30 mg total) by mouth daily. For mood and pain 30 capsule 1  . pregabalin (LYRICA) 75 MG capsule Take 1 capsule (75 mg total) by mouth 2 (two) times daily. 60 capsule 3   No current facility-administered medications for this visit.    Allergies  Allergen Reactions  . Amoxicillin Hives    Not sure if ever had penicillin  . Coconut Flavor Swelling     Discussed warning signs or symptoms. Please see discharge instructions. Patient expresses understanding.

## 2018-03-06 NOTE — Patient Instructions (Signed)
Thank you for coming in today. Get labs Try lyrica and cymbalta.  Next step is MRI brain if not better.   Recheck with me in 2 weeks.    Myofascial Pain Syndrome and Fibromyalgia Myofascial pain syndrome and fibromyalgia are both pain disorders. This pain may be felt mainly in your muscles.  Myofascial pain syndrome: ? Always has trigger points or tender points in the muscle that will cause pain when pressed. The pain may come and go. ? Usually affects your neck, upper back, and shoulder areas. The pain often radiates into your arms and hands.  Fibromyalgia: ? Has muscle pains and tenderness that come and go. ? Is often associated with fatigue and sleep disturbances. ? Has trigger points. ? Tends to be long-lasting (chronic), but is not life-threatening.  Fibromyalgia and myofascial pain are not the same. However, they often occur together. If you have both conditions, each can make the other worse. Both are common and can cause enough pain and fatigue to make day-to-day activities difficult. What are the causes? The exact causes of fibromyalgia and myofascial pain are not known. People with certain gene types may be more likely to develop fibromyalgia. Some factors can be triggers for both conditions, such as:  Spine disorders.  Arthritis.  Severe injury (trauma) and other physical stressors.  Being under a lot of stress.  A medical illness.  What are the signs or symptoms? Fibromyalgia The main symptom of fibromyalgia is widespread pain and tenderness in your muscles. This can vary over time. Pain is sometimes described as stabbing, shooting, or burning. You may have tingling or numbness, too. You may also have sleep problems and fatigue. You may wake up feeling tired and groggy (fibro fog). Other symptoms may include:  Bowel and bladder problems.  Headaches.  Visual problems.  Problems with odors and noises.  Depression or mood changes.  Painful menstrual periods  (dysmenorrhea).  Dry skin or eyes.  Myofascial pain syndrome Symptoms of myofascial pain syndrome include:  Tight, ropy bands of muscle.  Uncomfortable sensations in muscular areas, such as: ? Aching. ? Cramping. ? Burning. ? Numbness. ? Tingling. ? Muscle weakness.  Trouble moving certain muscles freely (range of motion).  How is this diagnosed? There are no specific tests to diagnose fibromyalgia or myofascial pain syndrome. Both can be hard to diagnose because their symptoms are common in many other conditions. Your health care provider may suspect one or both of these conditions based on your symptoms and medical history. Your health care provider will also do a physical exam. The key to diagnosing fibromyalgia is having pain, fatigue, and other symptoms for more than three months that cannot be explained by another condition. The key to diagnosing myofascial pain syndrome is finding trigger points in muscles that are tender and cause pain elsewhere in your body (referred pain). How is this treated? Treating fibromyalgia and myofascial pain often requires a team of health care providers. This usually starts with your primary provider and a physical therapist. You may also find it helpful to work with alternative health care providers, such as massage therapists or acupuncturists. Treatment for fibromyalgia may include medicines. This may include nonsteroidal anti-inflammatory drugs (NSAIDs), along with other medicines. Treatment for myofascial pain may also include:  NSAIDs.  Cooling and stretching of muscles.  Trigger point injections.  Sound wave (ultrasound) treatments to stimulate muscles.  Follow these instructions at home:  Take medicines only as directed by your health care provider.  Exercise as  directed by your health care provider or physical therapist.  Try to avoid stressful situations.  Practice relaxation techniques to control your stress. You may want to  try: ? Biofeedback. ? Visual imagery. ? Hypnosis. ? Muscle relaxation. ? Yoga. ? Meditation.  Talk to your health care provider about alternative treatments, such as acupuncture or massage treatment.  Maintain a healthy lifestyle. This includes eating a healthy diet and getting enough sleep.  Consider joining a support group.  Do not do activities that stress or strain your muscles. That includes repetitive motions and heavy lifting. Where to find more information:  National Fibromyalgia Association: www.fmaware.[place]: www.arthritis.org  American Chronic Pain Association: OEMDeals.dk Contact a health care provider if:  You have new symptoms.  Your symptoms get worse.  You have side effects from your medicines.  You have trouble sleeping.  Your condition is causing depression or anxiety. This information is not intended to replace advice given to you by your health care provider. Make sure you discuss any questions you have with your health care provider. Document Released: 08/07/2005 Document Revised: 01/13/2016 Document Reviewed: 05/13/2014 Elsevier Interactive Patient Education  [employer].

## 2018-03-08 LAB — CBC WITH DIFFERENTIAL/PLATELET
BASOS ABS: 52 {cells}/uL (ref 0–200)
BASOS PCT: 0.9 %
EOS ABS: 99 {cells}/uL (ref 15–500)
Eosinophils Relative: 1.7 %
HEMATOCRIT: 43.3 % (ref 35.0–45.0)
HEMOGLOBIN: 14.6 g/dL (ref 11.7–15.5)
Lymphs Abs: 1595 cells/uL (ref 850–3900)
MCH: 31.3 pg (ref 27.0–33.0)
MCHC: 33.7 g/dL (ref 32.0–36.0)
MCV: 92.9 fL (ref 80.0–100.0)
MPV: 9.6 fL (ref 7.5–12.5)
Monocytes Relative: 5.9 %
NEUTROS ABS: 3712 {cells}/uL (ref 1500–7800)
Neutrophils Relative %: 64 %
Platelets: 193 10*3/uL (ref 140–400)
RBC: 4.66 10*6/uL (ref 3.80–5.10)
RDW: 11.8 % (ref 11.0–15.0)
Total Lymphocyte: 27.5 %
WBC mixed population: 342 cells/uL (ref 200–950)
WBC: 5.8 10*3/uL (ref 3.8–10.8)

## 2018-03-08 LAB — COMPREHENSIVE METABOLIC PANEL
AG RATIO: 2 (calc) (ref 1.0–2.5)
ALBUMIN MSPROF: 4.3 g/dL (ref 3.6–5.1)
ALKALINE PHOSPHATASE (APISO): 42 U/L (ref 33–115)
ALT: 8 U/L (ref 6–29)
AST: 12 U/L (ref 10–30)
BILIRUBIN TOTAL: 0.6 mg/dL (ref 0.2–1.2)
BUN: 14 mg/dL (ref 7–25)
CHLORIDE: 108 mmol/L (ref 98–110)
CO2: 25 mmol/L (ref 20–32)
CREATININE: 0.8 mg/dL (ref 0.50–1.10)
Calcium: 8.9 mg/dL (ref 8.6–10.2)
GLOBULIN: 2.1 g/dL (ref 1.9–3.7)
Glucose, Bld: 84 mg/dL (ref 65–99)
POTASSIUM: 4.2 mmol/L (ref 3.5–5.3)
Sodium: 139 mmol/L (ref 135–146)
Total Protein: 6.4 g/dL (ref 6.1–8.1)

## 2018-03-08 LAB — TSH: TSH: 1.03 mIU/L

## 2018-03-08 LAB — VITAMIN B12: Vitamin B-12: 556 pg/mL (ref 200–1100)

## 2018-03-08 LAB — SEDIMENTATION RATE: Sed Rate: 2 mm/h (ref 0–20)

## 2018-03-08 LAB — B. BURGDORFI ANTIBODIES: B burgdorferi Ab IgG+IgM: <0.9 index

## 2018-03-08 LAB — CK: CK TOTAL: 98 U/L (ref 29–143)

## 2018-03-08 LAB — ANA: ANA: NEGATIVE

## 2018-03-11 ENCOUNTER — Encounter: Payer: Self-pay | Admitting: Family Medicine

## 2018-03-11 NOTE — Progress Notes (Signed)
Bilateral lower extremity EMG report received from June 20 2017. Normal report.  Will be scanned.

## 2018-03-20 NOTE — Progress Notes (Signed)
37 y.o. G55P3003 Married Caucasian female here for annual exam.    Has a stabbing pain in her lower abdomen for years, and it is fleeting.  Comes and goes quickly.  Not consistent.   No vaginal bleeding.   Seeing sports medicine.  Pain and numbness in her feet for years.  Now on Lyrica for pain.  Working dx is fibromyalgia or MS.  Had a nerve conduction test and it was normal.  MRIs - C4-5 disc protrusion.  Has seen a neurologist, PCPs.  Really wants answers.  Also has a rash on her back.   PCP: Dr Sheppard Coil     Patient's last menstrual period was 06/06/2015 (approximate).           Sexually active: Yes.    The current method of family planning is status post hysterectomy.    Exercising: Yes.    walking Smoker:  no  Health Maintenance: Pap:  03/19/2017 pap and HR HPV negative           03/16/16 Pap and HR HPV negative             03-08-15 Neg:Pos HR HPV History of abnormal Pap:  Yes. Colposcopy possible adenocarcinoma in situ, conization CIN II and CIN II/III on ECC. Final hysterectomy pathology - negative for cervical dysplasia. MMG:  n/a Colonoscopy:  n/a BMD:   n/a  Result  n/a TDaP:  2015 HIV: 09/29/16 Negative Screening Labs: Already performed per patient    reports that she has been smoking cigarettes.  She has a 12.00 pack-year smoking history. She has never used smokeless tobacco. She reports that she drank about 3.6 oz of alcohol per week. She reports that she does not use drugs.  Past Medical History:  Diagnosis Date  . Abnormal Pap smear of cervix 03-08-15   --normal pap:Pos HR HPV w/#16 Pos genotype--colposcopy ECC at least high grade dysplasia;cannot exclude adenocarcinoma insitu  . Anemia    d/t heavy menstrual cycles  . Anxiety   . Cancer ([place])    cervical pre cancerous cell  . Cervical scoliosis 02/04/2018  . Congenital nystagmus 06/18/2017  . Depression   . Dysmenorrhea   . GERD (gastroesophageal reflux disease) 06/23/2014  . Nystagmus    her  whole life, sees [facility]    Past Surgical History:  Procedure Laterality Date  . ABDOMINAL HYSTERECTOMY  06-22-15   R-TLH w/Bil.Salpingectomy  . CERVICAL CONIZATION W/BX N/A 04/27/2015   Procedure: CONIZATION CERVIX WITH BIOPSY with ECC;  Surgeon: Nunzio Cobbs, MD;  Location: [facility];  Service: Gynecology;  Laterality: N/A;  . CESAREAN SECTION     2009, 2006, and 2004  . CYSTOSCOPY N/A 06/22/2015   Procedure: CYSTOSCOPY;  Surgeon: Nunzio Cobbs, MD;  Location: [facility];  Service: Gynecology;  Laterality: N/A;  . WISDOM TOOTH EXTRACTION      Current Outpatient Medications  Medication Sig Dispense Refill  . busPIRone (BUSPAR) 30 MG tablet Take 30 mg by mouth as needed. 1/2 tablet as needed    . cyclobenzaprine (FLEXERIL) 10 MG tablet Take 1 tablet (10 mg total) by mouth 2 (two) times daily as needed. 20 tablet 0  . pregabalin (LYRICA) 75 MG capsule Take 1 capsule (75 mg total) by mouth 2 (two) times daily. 60 capsule 3   No current facility-administered medications for this visit.     Family History  Problem Relation Age of Onset  . Colon cancer Father   . Cancer Sister  14       Dec from MVA--but had hysterectomy age 42 ?CA (pt. states mom told her -- sister dx'd with germ induced ovarian ca)  . Hyperlipidemia Mother   . Cancer Brother        had hysterectomy at age 46  . Diabetes Maternal Grandmother   . Diabetes Paternal Grandfather   . Stroke Maternal Grandfather     Review of Systems  Constitutional: Negative.   HENT: Negative.   Eyes: Negative.   Respiratory: Negative.   Cardiovascular: Negative.   Gastrointestinal: Positive for constipation.  Endocrine: Negative.   Genitourinary: Negative.   Musculoskeletal: Negative.   Skin: Negative.   Allergic/Immunologic: Negative.   Neurological:       Depression Difficulty with memory  Hematological: Negative.   Psychiatric/Behavioral: Negative.     Exam:   BP 106/72 (BP Location: Right Arm,  Patient Position: Sitting)   Pulse 76   Ht 5\' 7"  (1.702 m)   Wt 181 lb 3.2 oz (82.2 kg)   LMP 06/06/2015 (Approximate)   BMI 28.38 kg/m     General appearance: alert, cooperative and appears stated age Head: Normocephalic, without obvious abnormality, atraumatic Neck: no adenopathy, supple, symmetrical, trachea midline and thyroid normal to inspection and palpation Lungs: clear to auscultation bilaterally Breasts: normal appearance, no masses or tenderness, No nipple retraction or dimpling, No nipple discharge or bleeding, No axillary or supraclavicular adenopathy Heart: regular rate and rhythm Abdomen: soft, non-tender; no masses, no organomegaly Extremities: extremities normal, atraumatic, no cyanosis or edema Skin: Skin color, texture, turgor normal. No rashes or lesions Lymph nodes: Cervical, supraclavicular, and axillary nodes normal. No abnormal inguinal nodes palpated Neurologic: Grossly normal  Pelvic: External genitalia:  no lesions              Urethra:  normal appearing urethra with no masses, tenderness or lesions              Bartholins and Skenes: normal                 Vagina: normal appearing vagina with normal color and discharge, no lesions              Cervix:  absent              Pap taken: Yes.   Bimanual Exam:  Uterus:  absent              Adnexa: no mass, fullness, tenderness           Chaperone was present for exam.  Assessment:   Well woman visit with normal exam. Status post robotic total laparoscopic hysterectomy with bilateral salpingectomy. Final pathology negative.  Preprocedure pathology - cervical adenocarcinoma in situ, CIN II and III. FH colon cancer age 88.   Plan: Mammogram screening age 12. Recommended self breast awareness. Pap and HR HPV as above. Guidelines for Calcium, Vitamin D, regular exercise program including cardiovascular and weight bearing exercise.   Follow up annually and prn.   After visit summary provided.

## 2018-03-21 ENCOUNTER — Other Ambulatory Visit: Payer: Self-pay

## 2018-03-21 ENCOUNTER — Other Ambulatory Visit (HOSPITAL_COMMUNITY)
Admission: RE | Admit: 2018-03-21 | Discharge: 2018-03-21 | Disposition: A | Discharge status: Home / Self Care | Payer: BLUE CROSS/BLUE SHIELD | Source: Ambulatory Visit | Attending: Obstetrics and Gynecology | Admitting: Obstetrics and Gynecology

## 2018-03-21 ENCOUNTER — Encounter: Payer: Self-pay | Admitting: Obstetrics and Gynecology

## 2018-03-21 ENCOUNTER — Ambulatory Visit: Payer: BLUE CROSS/BLUE SHIELD | Admitting: Obstetrics and Gynecology

## 2018-03-21 VITALS — BP 106/72 | HR 76 | Ht 67.0 in | Wt 181.2 lb

## 2018-03-21 DIAGNOSIS — Z01419 Encounter for gynecological examination (general) (routine) without abnormal findings: Secondary | ICD-10-CM | POA: Diagnosis not present

## 2018-03-25 ENCOUNTER — Ambulatory Visit (INDEPENDENT_AMBULATORY_CARE_PROVIDER_SITE_OTHER): Payer: BLUE CROSS/BLUE SHIELD

## 2018-03-25 DIAGNOSIS — R202 Paresthesia of skin: Secondary | ICD-10-CM | POA: Diagnosis not present

## 2018-03-26 LAB — CYTOLOGY - PAP
Diagnosis: NEGATIVE
HPV: NOT DETECTED

## 2018-03-28 ENCOUNTER — Encounter: Payer: Self-pay | Admitting: Obstetrics and Gynecology

## 2018-03-28 ENCOUNTER — Telehealth: Payer: Self-pay | Admitting: Obstetrics and Gynecology

## 2018-03-28 NOTE — Telephone Encounter (Signed)
I just sent a My Chart message to the patient that the pap is normal and negative HR HPV.

## 2018-03-28 NOTE — Telephone Encounter (Signed)
Routing to Dr. Quincy Simmonds to review pap dated 03/21/17 and advise.   Next AEX 03/24/19

## 2018-03-28 NOTE — Telephone Encounter (Signed)
Patient sent the following correspondence through [place]. Routing to triage to assist patient with request.  I was wondering if results were back from the lab?  Thank you

## 2018-03-28 NOTE — Telephone Encounter (Signed)
Mychart message Viewed by Patriciaann Clan on 03/28/2018 5:17 PM  Encounter closed.

## 2018-03-28 NOTE — Telephone Encounter (Signed)
Left message to call Coren Sagan at [phone].  

## 2018-04-16 ENCOUNTER — Other Ambulatory Visit: Payer: Self-pay

## 2018-04-16 ENCOUNTER — Ambulatory Visit (INDEPENDENT_AMBULATORY_CARE_PROVIDER_SITE_OTHER): Payer: BLUE CROSS/BLUE SHIELD | Admitting: Psychiatry

## 2018-04-16 ENCOUNTER — Encounter (HOSPITAL_COMMUNITY): Payer: Self-pay | Admitting: Psychiatry

## 2018-04-16 VITALS — BP 108/70 | HR 94 | Ht 67.0 in | Wt 180.0 lb

## 2018-04-16 DIAGNOSIS — F0630 Mood disorder due to known physiological condition, unspecified: Secondary | ICD-10-CM

## 2018-04-16 DIAGNOSIS — M797 Fibromyalgia: Secondary | ICD-10-CM | POA: Diagnosis not present

## 2018-04-16 DIAGNOSIS — F419 Anxiety disorder, unspecified: Secondary | ICD-10-CM | POA: Diagnosis not present

## 2018-04-16 DIAGNOSIS — F411 Generalized anxiety disorder: Secondary | ICD-10-CM

## 2018-04-16 DIAGNOSIS — F329 Major depressive disorder, single episode, unspecified: Secondary | ICD-10-CM

## 2018-04-16 DIAGNOSIS — F32A Depression, unspecified: Secondary | ICD-10-CM

## 2018-04-16 MED ORDER — DULOXETINE HCL 20 MG PO CPEP: 20.0000 mg | ORAL_CAPSULE | Freq: Every day | ORAL | 0 refills | Status: DC

## 2018-04-16 NOTE — Progress Notes (Signed)
Psychiatric Initial Adult Assessment   Patient Identification: Tara Velazquez MRN:  [id number] Date of Evaluation:  04/16/2018 Referral Source: Dr.  Georgina Snell Chief Complaint:   Chief Complaint    Establish Care; Other     Visit Diagnosis:    ICD-10-CM   1. Mood disorder in conditions classified elsewhere F06.30   2. Fibromyalgia M79.7   3. Anxiety and depression F41.9    F32.9     History of Present Illness:  36 years old currently married Caucasian female referred by primary care physician for management of anxiety possible mood disorder. Also suffers from fibromyalgia and pain conditions  Recently started on Lyrica status up the mood symptoms as well as some pain but that she still feels some pain and aches not of the joints MRI has been negative. Says she was reluctant start Cymbalta because some concern about side effects. She still feels anxious worries a lot about her kids about her parents about her health in general. No psychotic or paranoid symptoms Does give history of mood swings including irritability or getting agitated easily at times she has high period of 3-4 days of elevated mood and racing thoughts increased distraction was no associated psychotic symptoms or risk taking activity. She does have depressed days but that does not last for long she does feel down and depressed and motivated for times but not for weeks She had been treated for depression and mood symptoms for a long time since her early childhood at age 37 she was also sent for counseling she lost her sister when she was 63 years of age her sister died of a car accident and during her teenage years she had done counseling She has been on and off of different medications including Effexor that made her mean and Wellbutrin worsened her anxiety, ris[perda  help but it made her sleepy. Gabapentin made her not to go to sleep Also diagnosed with psosible bipolar ro borderline personality at [place]. Says they  leaned more towards BPD as her emotions would shorter and reactionary She has stopped alcohol since starting lyrica . Says would drink 2-3 times a wekk  no drink since 6 weeks  Modifying factors is her 3 sons husband.  Aggravating factors is pain She is also born 2 months post term she also suffers from this diagnosis since early childhood Says husband is somewhat distant to her problems as well as parents  Duration since childhood   Associated Signs/Symptoms: Depression Symptoms:  depressed mood, difficulty concentrating, anxiety, loss of energy/fatigue, (Hypo) Manic Symptoms:  Distractibility, Anxiety Symptoms:  Excessive Worry, Psychotic Symptoms:  denies PTSD Symptoms: NA  Past Psychiatric History: depression, anxiety  Previous Psychotropic Medications: Yes   Substance Abuse History in the last 12 months:  Yes.    Consequences of Substance Abuse: NA  Past Medical History:  Past Medical History:  Diagnosis Date  . Abnormal Pap smear of cervix 03-08-15   --normal pap:Pos HR HPV w/#16 Pos genotype--colposcopy ECC at least high grade dysplasia;cannot exclude adenocarcinoma insitu  . Anemia    d/t heavy menstrual cycles  . Anxiety   . Cancer ([place])    cervical pre cancerous cell  . Cervical scoliosis 02/04/2018  . Congenital nystagmus 06/18/2017  . Depression   . Dysmenorrhea   . GERD (gastroesophageal reflux disease) 06/23/2014  . Nystagmus    her whole life, sees [facility]    Past Surgical History:  Procedure Laterality Date  . ABDOMINAL HYSTERECTOMY  06-22-15   R-TLH  w/Bil.Salpingectomy  . CERVICAL CONIZATION W/BX N/A 04/27/2015   Procedure: CONIZATION CERVIX WITH BIOPSY with ECC;  Surgeon: Nunzio Cobbs, MD;  Location: [facility];  Service: Gynecology;  Laterality: N/A;  . CESAREAN SECTION     2009, 2006, and 2004  . CYSTOSCOPY N/A 06/22/2015   Procedure: CYSTOSCOPY;  Surgeon: Nunzio Cobbs, MD;  Location: [facility];  Service: Gynecology;   Laterality: N/A;  . WISDOM TOOTH EXTRACTION      Family Psychiatric History: denies   Family History:  Family History  Problem Relation Age of Onset  . Colon cancer Father   . Cancer Sister 36       Dec from MVA--but had hysterectomy age 76 ?CA (pt. states mom told her -- sister dx'd with germ induced ovarian ca)  . Hyperlipidemia Mother   . Cancer Brother        had hysterectomy at age 73  . Diabetes Maternal Grandmother   . Diabetes Paternal Grandfather   . Stroke Maternal Grandfather     Social History:   Social History   Socioeconomic History  . Marital status: Married    Spouse name: Not on file  . Number of children: Not on file  . Years of education: Not on file  . Highest education level: Not on file  Occupational History  . Not on file  Social Needs  . Financial resource strain: Not on file  . Food insecurity:    Worry: Not on file    Inability: Not on file  . Transportation needs:    Medical: Not on file    Non-medical: Not on file  Tobacco Use  . Smoking status: Current Every Day Smoker    Packs/day: 1.00    Years: 15.00    Pack years: 15.00    Types: Cigarettes  . Smokeless tobacco: Never Used  Substance and Sexual Activity  . Alcohol use: Not Currently    Alcohol/week: 6.0 standard drinks    Types: 1 Glasses of wine, 1 Cans of beer, 1 Shots of liquor, 3 Standard drinks or equivalent per week    Comment: once every couple of weeks   . Drug use: No  . Sexual activity: Yes    Partners: Male    Birth control/protection: Surgical    Comment: Tubal/Hysterectomy  Lifestyle  . Physical activity:    Days per week: Not on file    Minutes per session: Not on file  . Stress: Not on file  Relationships  . Social connections:    Talks on phone: Not on file    Gets together: Not on file    Attends religious service: Not on file    Active member of club or organization: Not on file    Attends meetings of clubs or organizations: Not on file     Relationship status: Not on file  Other Topics Concern  . Not on file  Social History Narrative   Work or School: homemaker      Home Situation: lives with husband and 3 children      Spiritual Beliefs: none      Lifestyle: walks/jogs 2 times per week/ diet is poor       Additional Social History: grew With parents and 3 sisters one sister died of a car accident note, she did good in schoolbut she suffered from depression anxiety when she was younger.\ Married for last 10 years have 3 kids   Allergies:   Allergies  Allergen Reactions  . Amoxicillin Hives    Not sure if ever had penicillin  . Coconut Flavor Swelling    Metabolic Disorder Labs: Lab Results  Component Value Date   HGBA1C 4.8 06/25/2014   No results found for: PROLACTIN Lab Results  Component Value Date   CHOL 155 09/29/2016   TRIG 78 09/29/2016   HDL 46 (L) 09/29/2016   CHOLHDL 3.4 09/29/2016   VLDL 16 09/29/2016   LDLCALC 93 09/29/2016   LDLCALC 90 03/16/2016     Current Medications: Current Outpatient Medications  Medication Sig Dispense Refill  . pregabalin (LYRICA) 75 MG capsule Take 1 capsule (75 mg total) by mouth 2 (two) times daily. 60 capsule 3  . busPIRone (BUSPAR) 30 MG tablet Take 30 mg by mouth as needed. 1/2 tablet as needed    . cyclobenzaprine (FLEXERIL) 10 MG tablet Take 1 tablet (10 mg total) by mouth 2 (two) times daily as needed. (Patient not taking: Reported on 04/16/2018) 20 tablet 0  . DULoxetine (CYMBALTA) 20 MG capsule Take 1 capsule (20 mg total) by mouth daily. 30 capsule 0  . DULoxetine (CYMBALTA) 30 MG capsule Take 30 mg by mouth daily.     No current facility-administered medications for this visit.     Neurologic: Headache: No Seizure: No Paresthesias:No  Musculoskeletal: Strength & Muscle Tone: within normal limits Gait & Station: normal Patient leans: no lean  Psychiatric Specialty Exam: Review of Systems  Cardiovascular: Negative for chest pain.   Neurological: Negative for tremors.  Psychiatric/Behavioral: Negative for hallucinations and substance abuse. The patient is nervous/anxious.     Blood pressure 108/70, pulse 94, height 5\' 7"  (1.702 m), weight 180 lb (81.6 kg), last menstrual period 06/06/2015.Body mass index is 28.19 kg/m.  General Appearance: Casual  Eye Contact:  Fair  Speech:  Normal Rate  Volume:  Normal  Mood:  Anxious  Affect:  Appropriate  Thought Process:  Goal Directed  Orientation:  Full (Time, Place, and Person)  Thought Content:  Rumination  Suicidal Thoughts:  No  Homicidal Thoughts:  No  Memory:  Immediate;   Fair Recent;   Fair  Judgement:  Fair  Insight:  Fair  Psychomotor Activity:  Normal  Concentration:  Concentration: Fair and Attention Span: Good  Recall:  [employer] of Knowledge:Good  Language: Good  Akathisia:  No  Handed:  Right  AIMS (if indicated):    Assets:  Desire for Improvement  ADL's:  Intact  Cognition: WNL  Sleep:  fair    Treatment Plan Summary: Medication management and Plan as follows  1. Mood disorder possible bipolar 2,  lyrica helping. Can continue 2. GAD : can start small dose of cymbalta. Reviewed side effects and cocnern. Can take buspar if needed half of 15mg  . Has tablets 3. Fibromyalgia: MRI negative . No clear etiology. lyrica has helped. Some, can continue. Consider exercise, massages  Add cymbalta 20mg  smallest dose to increase if toelarte it ok.   More than 50% time spent in counseling and coordination of care including patient education side effects and concerns were addressed Fu 4 w or earlier if needed   Merian Capron, MD 8/27/20199:33 AM

## 2018-04-23 ENCOUNTER — Encounter: Payer: Self-pay | Admitting: Family Medicine

## 2018-05-23 ENCOUNTER — Encounter (HOSPITAL_COMMUNITY): Payer: Self-pay | Admitting: Psychiatry

## 2018-05-23 ENCOUNTER — Ambulatory Visit (HOSPITAL_COMMUNITY): Payer: BLUE CROSS/BLUE SHIELD | Admitting: Psychiatry

## 2018-05-23 VITALS — BP 112/72 | HR 83 | Ht 67.0 in | Wt 179.0 lb

## 2018-05-23 DIAGNOSIS — F32A Depression, unspecified: Secondary | ICD-10-CM

## 2018-05-23 DIAGNOSIS — F419 Anxiety disorder, unspecified: Secondary | ICD-10-CM

## 2018-05-23 DIAGNOSIS — F0630 Mood disorder due to known physiological condition, unspecified: Secondary | ICD-10-CM

## 2018-05-23 DIAGNOSIS — M797 Fibromyalgia: Secondary | ICD-10-CM

## 2018-05-23 DIAGNOSIS — F329 Major depressive disorder, single episode, unspecified: Secondary | ICD-10-CM

## 2018-05-23 DIAGNOSIS — F411 Generalized anxiety disorder: Secondary | ICD-10-CM | POA: Diagnosis not present

## 2018-05-23 MED ORDER — DULOXETINE HCL 30 MG PO CPEP: 30.0000 mg | ORAL_CAPSULE | Freq: Every day | ORAL | 1 refills | Status: DC

## 2018-05-23 NOTE — Progress Notes (Signed)
[facility] Outpatient Follow up visit   Patient Identification: Tara Velazquez MRN:  [id number] Date of Evaluation:  05/23/2018 Referral Source: Dr.  Georgina Snell Chief Complaint:   Chief Complaint    Follow-up; Depression     Visit Diagnosis:    ICD-10-CM   1. Anxiety and depression F41.9    F32.9   2. Mood disorder in conditions classified elsewhere F06.30   3. GAD (generalized anxiety disorder) F41.1   4. Fibromyalgia M79.7     History of Present Illness:  37 years old currently married Caucasian female initially referred by primary care physician for management of anxiety possible mood disorder. Also suffers from fibromyalgia and pain conditions  Last visit started on cymbalta. Also is on lyrica. Some improvement in mood, anxiety and less swings to hyyped or low days Now taking 30mg  Has had worries related to her kdis and parents about her health in general. No psychotic or paranoid symptoms Does give history of mood swings including irritability, she lost her sister when she was 41 years of age her sister died of a car accident and during her teenage years she had done counseling She has been on and off of different medications including Effexor that made her mean and Wellbutrin worsened her anxiety, ris[perda  help but it made her sleepy. Gabapentin made her not to go to sleep Also diagnosed with psosible bipolar ro borderline personality [place]. Says they leaned more towards BPD no drinks more then 2 months   Modifying factors is her 3 sons husband.  Aggravating factors: pain.  She is also born 2 months post term   Says husband is somewhat distant to her problems as well as parents  Duration since childhood   Past Psychiatric History: depression, anxiety  Previous Psychotropic Medications: Yes   Substance Abuse History in the last 12 months:  Yes.    Consequences of Substance Abuse: NA  Past Medical History:  Past Medical History:  Diagnosis Date  . Abnormal Pap  smear of cervix 03-08-15   --normal pap:Pos HR HPV w/#16 Pos genotype--colposcopy ECC at least high grade dysplasia;cannot exclude adenocarcinoma insitu  . Anemia    d/t heavy menstrual cycles  . Anxiety   . Cancer ([place])    cervical pre cancerous cell  . Cervical scoliosis 02/04/2018  . Congenital nystagmus 06/18/2017  . Depression   . Dysmenorrhea   . GERD (gastroesophageal reflux disease) 06/23/2014  . Nystagmus    her whole life, sees [facility]    Past Surgical History:  Procedure Laterality Date  . ABDOMINAL HYSTERECTOMY  06-22-15   R-TLH w/Bil.Salpingectomy  . CERVICAL CONIZATION W/BX N/A 04/27/2015   Procedure: CONIZATION CERVIX WITH BIOPSY with ECC;  Surgeon: Nunzio Cobbs, MD;  Location: [facility];  Service: Gynecology;  Laterality: N/A;  . CESAREAN SECTION     2009, 2006, and 2004  . CYSTOSCOPY N/A 06/22/2015   Procedure: CYSTOSCOPY;  Surgeon: Nunzio Cobbs, MD;  Location: [facility];  Service: Gynecology;  Laterality: N/A;  . WISDOM TOOTH EXTRACTION      Family Psychiatric History: denies   Family History:  Family History  Problem Relation Age of Onset  . Colon cancer Father   . Cancer Sister 73       Dec from MVA--but had hysterectomy age 19 ?CA (pt. states mom told her -- sister dx'd with germ induced ovarian ca)  . Hyperlipidemia Mother   . Cancer Brother  had hysterectomy at age 69  . Diabetes Maternal Grandmother   . Diabetes Paternal Grandfather   . Stroke Maternal Grandfather     Social History:   Social History   Socioeconomic History  . Marital status: Married    Spouse name: Not on file  . Number of children: Not on file  . Years of education: Not on file  . Highest education level: Not on file  Occupational History  . Not on file  Social Needs  . Financial resource strain: Not on file  . Food insecurity:    Worry: Not on file    Inability: Not on file  . Transportation needs:    Medical: Not on file    Non-medical:  Not on file  Tobacco Use  . Smoking status: Current Every Day Smoker    Packs/day: 1.00    Years: 15.00    Pack years: 15.00    Types: Cigarettes  . Smokeless tobacco: Never Used  Substance and Sexual Activity  . Alcohol use: Not Currently    Alcohol/week: 6.0 standard drinks    Types: 1 Glasses of wine, 1 Cans of beer, 1 Shots of liquor, 3 Standard drinks or equivalent per week    Comment: once every couple of weeks   . Drug use: No  . Sexual activity: Yes    Partners: Male    Birth control/protection: Surgical    Comment: Tubal/Hysterectomy  Lifestyle  . Physical activity:    Days per week: Not on file    Minutes per session: Not on file  . Stress: Not on file  Relationships  . Social connections:    Talks on phone: Not on file    Gets together: Not on file    Attends religious service: Not on file    Active member of club or organization: Not on file    Attends meetings of clubs or organizations: Not on file    Relationship status: Not on file  Other Topics Concern  . Not on file  Social History Narrative   Work or School: homemaker      Home Situation: lives with husband and 3 children      Spiritual Beliefs: none      Lifestyle: walks/jogs 2 times per week/ diet is poor          Allergies:   Allergies  Allergen Reactions  . Amoxicillin Hives    Not sure if ever had penicillin  . Coconut Flavor Swelling    Metabolic Disorder Labs: Lab Results  Component Value Date   HGBA1C 4.8 06/25/2014   No results found for: PROLACTIN Lab Results  Component Value Date   CHOL 155 09/29/2016   TRIG 78 09/29/2016   HDL 46 (L) 09/29/2016   CHOLHDL 3.4 09/29/2016   VLDL 16 09/29/2016   LDLCALC 93 09/29/2016   LDLCALC 90 03/16/2016     Current Medications: Current Outpatient Medications  Medication Sig Dispense Refill  . busPIRone (BUSPAR) 30 MG tablet Take 30 mg by mouth as needed. 1/2 tablet as needed    . DULoxetine (CYMBALTA) 30 MG capsule Take 1  capsule (30 mg total) by mouth daily. 30 capsule 1  . pregabalin (LYRICA) 75 MG capsule Take 1 capsule (75 mg total) by mouth 2 (two) times daily. 60 capsule 3  . cyclobenzaprine (FLEXERIL) 10 MG tablet Take 1 tablet (10 mg total) by mouth 2 (two) times daily as needed. (Patient not taking: Reported on 05/23/2018) 20 tablet 0  No current facility-administered medications for this visit.       Psychiatric Specialty Exam: Review of Systems  Cardiovascular: Negative for chest pain and palpitations.  Neurological: Negative for tremors.  Psychiatric/Behavioral: Negative for hallucinations and substance abuse.    Blood pressure 112/72, pulse 83, height 5\' 7"  (1.702 m), weight 179 lb (81.2 kg), last menstrual period 06/06/2015, SpO2 97 %.Body mass index is 28.04 kg/m.  General Appearance: Casual  Eye Contact:  Fair  Speech:  Normal Rate  Volume:  Normal  Mood: some better  Affect:  Appropriate  Thought Process:  Goal Directed  Orientation:  Full (Time, Place, and Person)  Thought Content:  Rumination  Suicidal Thoughts:  No  Homicidal Thoughts:  No  Memory:  Immediate;   Fair Recent;   Fair  Judgement:  Fair  Insight:  Fair  Psychomotor Activity:  Normal  Concentration:  Concentration: Fair and Attention Span: Good  Recall:  [employer] of Knowledge:Good  Language: Good  Akathisia:  No  Handed:  Right  AIMS (if indicated):    Assets:  Desire for Improvement  ADL's:  Intact  Cognition: WNL  Sleep:  fair    Treatment Plan Summary: Medication management and Plan as follows  1. Mood disorder possible bipolar 2,  Less moody, continue lyrica 2. GAD : anxiety is less. Continue cymbalta. 30mg   3. Fibromyalgia: MRI negative . No clear etiology. lyrica has helped more in the beginning.   Reviewed concerns and questions. Fu 43m.    Merian Capron, MD 10/3/20199:00 AM

## 2018-06-05 ENCOUNTER — Telehealth (HOSPITAL_COMMUNITY): Payer: Self-pay

## 2018-06-05 NOTE — Telephone Encounter (Signed)
Patient called stating the she is taking Cymbalta and Lyrica and her vision is blurry. She thinks it is from the medication she is taking. Please review and advise.

## 2018-06-06 ENCOUNTER — Encounter: Payer: Self-pay | Admitting: Family Medicine

## 2018-06-06 NOTE — Telephone Encounter (Signed)
Informed patient per Dr. De Nurse that we can stop the Cymbalta or she can speak with her other provider about the Lyrica. Patient just stated thank you and did not state if she wants to stop Cymbalta.

## 2018-06-06 NOTE — Telephone Encounter (Signed)
We can stop cymbalta. Or she can ask other provider if wants to cut down lyrica if that's concerning  Let us know

## 2018-06-24 ENCOUNTER — Encounter: Payer: Self-pay | Admitting: Family Medicine

## 2018-06-24 DIAGNOSIS — R202 Paresthesia of skin: Secondary | ICD-10-CM

## 2018-06-24 DIAGNOSIS — M791 Myalgia, unspecified site: Secondary | ICD-10-CM

## 2018-07-02 DIAGNOSIS — R200 Anesthesia of skin: Secondary | ICD-10-CM | POA: Diagnosis not present

## 2018-07-02 DIAGNOSIS — H539 Unspecified visual disturbance: Secondary | ICD-10-CM | POA: Diagnosis not present

## 2018-07-02 DIAGNOSIS — M791 Myalgia, unspecified site: Secondary | ICD-10-CM | POA: Diagnosis not present

## 2018-07-02 DIAGNOSIS — R202 Paresthesia of skin: Secondary | ICD-10-CM | POA: Diagnosis not present

## 2018-07-02 DIAGNOSIS — F329 Major depressive disorder, single episode, unspecified: Secondary | ICD-10-CM | POA: Diagnosis not present

## 2018-07-03 DIAGNOSIS — R238 Other skin changes: Secondary | ICD-10-CM | POA: Diagnosis not present

## 2018-07-03 DIAGNOSIS — R200 Anesthesia of skin: Secondary | ICD-10-CM | POA: Diagnosis not present

## 2018-07-03 DIAGNOSIS — R202 Paresthesia of skin: Secondary | ICD-10-CM | POA: Diagnosis not present

## 2018-07-05 ENCOUNTER — Other Ambulatory Visit: Payer: Self-pay | Admitting: Family Medicine

## 2018-07-05 DIAGNOSIS — M797 Fibromyalgia: Secondary | ICD-10-CM

## 2018-07-05 NOTE — Telephone Encounter (Signed)
Dr Georgina Snell wanted to see her back 2 weeks after her last visit and he was the prescriber - let's have her schedule follow-up. I will send 2 weeks of medicine to hold her over

## 2018-07-08 DIAGNOSIS — M791 Myalgia, unspecified site: Secondary | ICD-10-CM | POA: Diagnosis not present

## 2018-07-08 DIAGNOSIS — R200 Anesthesia of skin: Secondary | ICD-10-CM | POA: Diagnosis not present

## 2018-07-08 DIAGNOSIS — F17210 Nicotine dependence, cigarettes, uncomplicated: Secondary | ICD-10-CM | POA: Diagnosis not present

## 2018-07-08 DIAGNOSIS — H539 Unspecified visual disturbance: Secondary | ICD-10-CM | POA: Diagnosis not present

## 2018-07-08 DIAGNOSIS — F419 Anxiety disorder, unspecified: Secondary | ICD-10-CM | POA: Diagnosis not present

## 2018-07-08 DIAGNOSIS — R202 Paresthesia of skin: Secondary | ICD-10-CM | POA: Diagnosis not present

## 2018-07-11 DIAGNOSIS — G971 Other reaction to spinal and lumbar puncture: Secondary | ICD-10-CM | POA: Diagnosis not present

## 2018-07-11 DIAGNOSIS — R51 Headache: Secondary | ICD-10-CM | POA: Diagnosis not present

## 2018-07-11 DIAGNOSIS — F17210 Nicotine dependence, cigarettes, uncomplicated: Secondary | ICD-10-CM | POA: Diagnosis not present

## 2018-07-23 ENCOUNTER — Ambulatory Visit (HOSPITAL_COMMUNITY): Payer: BLUE CROSS/BLUE SHIELD | Admitting: Psychiatry

## 2018-07-25 ENCOUNTER — Encounter: Payer: Self-pay | Admitting: Osteopathic Medicine

## 2018-07-25 DIAGNOSIS — H5316 Psychophysical visual disturbances: Secondary | ICD-10-CM | POA: Diagnosis not present

## 2018-07-25 DIAGNOSIS — H16012 Central corneal ulcer, left eye: Secondary | ICD-10-CM | POA: Diagnosis not present

## 2018-07-26 ENCOUNTER — Encounter: Payer: Self-pay | Admitting: Family Medicine

## 2018-07-26 ENCOUNTER — Ambulatory Visit (INDEPENDENT_AMBULATORY_CARE_PROVIDER_SITE_OTHER): Payer: BLUE CROSS/BLUE SHIELD | Admitting: Family Medicine

## 2018-07-26 ENCOUNTER — Encounter: Payer: Self-pay | Admitting: Osteopathic Medicine

## 2018-07-26 VITALS — BP 120/81 | HR 70 | Temp 97.6°F | Wt 192.0 lb

## 2018-07-26 DIAGNOSIS — B070 Plantar wart: Secondary | ICD-10-CM

## 2018-07-26 NOTE — Patient Instructions (Signed)
Thank you for coming in today.  I think the thing on the foot is a plantar wart. In this location callus or plantar warts are some times called a "Corn" You are correct to use those pads.  If not improving or if worsening ok to see a podiatrist to cut it off/out.  We dont need to do that today.    Warts Warts are small growths on the skin. They are common and can occur on various areas of the body. A person may have one wart or multiple warts. Most warts are not painful, and they usually do not cause problems. However, warts can cause pain if they are large or occur in an area of the body where pressure will be applied to them, such as the bottom of the foot. In many cases, warts do not require treatment. They usually go away on their own over a period of many months to a couple years. Various treatments may be done for warts that cause problems or do not go away. Sometimes, warts go away and then come back again. What are the causes? Warts are caused by a type of virus that is called human papillomavirus (HPV). This virus can spread from person to person through direct contact. Warts can also spread to other areas of the body when a person scratches a wart and then scratches another area of his or her body. What increases the risk? Warts are more likely to develop in:  People who are 69-66 years of age.  People who have a weakened body defense system (immune system).  What are the signs or symptoms? A wart may be round or oval or have an irregular shape. Most warts have a rough surface. Warts may range in color from skin color to light yellow, brown, or gray. They are generally less than  inch (1.3 cm) in size. Most warts are painless, but some can be painful when pressure is applied to them. How is this diagnosed? A wart can usually be diagnosed from its appearance. In some cases, a tissue sample may be removed (biopsy) to be looked at under a microscope. How is this treated? In many  cases, warts do not need treatment. If treatment is needed, options may include:  Applying medicated solutions, creams, or patches to the wart. These may be over-the-counter or prescription medicines that make the skin soft so that layers will gradually shed away. In many cases, the medicine is applied one or two times per day and covered with a bandage.  Putting duct tape over the top of the wart (occlusion). You will leave the tape in place for as long as told by your health care provider, then you will replace it with a new strip of tape. This is done until the wart goes away.  Freezing the wart with liquid nitrogen (cryotherapy).  Burning the wart with: ? Laser treatment. ? An electrified probe (electrocautery).  Injection of a medicine (Candida antigen) into the wart to help the body's immune system to fight off the wart.  Surgery to remove the wart.  Follow these instructions at home:  Apply over-the-counter and prescription medicines only as told by your health care provider.  Do not apply over-the-counter wart medicines to your face or genitals before you ask your health care provider if it is okay to do so.  Do not scratch or pick at a wart.  Wash your hands after you touch a wart.  Avoid shaving hair that is over a  wart.  Keep all follow-up visits as told by your health care provider. This is important. Contact a health care provider if:  Your warts do not improve after treatment.  You have redness, swelling, or pain at the site of a wart.  You have bleeding from a wart that does not stop with light pressure.  You have diabetes and you develop a wart. This information is not intended to replace advice given to you by your health care provider. Make sure you discuss any questions you have with your health care provider. Document Released: 05/17/2005 Document Revised: 01/19/2016 Document Reviewed: 11/02/2014 Elsevier Interactive Patient Education  [employer].

## 2018-07-26 NOTE — Progress Notes (Signed)
Tara Velazquez is a 37 y.o. female who presents to [facility] today for nodule right lateral foot.  Tara Velazquez was originally scheduled with her primary care provider today however she was switched to my schedule due to the orthopedic/sports medicine nature of her complaint.  She notes a several month history of a painful nodule at the right lateral foot at fifth MTP.  She has been treating it with salicylic acid corn pads which has helped quite a bit.  She notes it reduced in size and it went away a bit.  When she stopped using the pads it came back.  She notes that when she walks barefoot it tends to be more plantar and painful.  When she wear shoes she notes it is not painful at all.  She denies any injury fevers chills nausea vomiting or diarrhea.    ROS:  As above  Exam:  BP 120/81   Pulse 70   Temp 97.6 F (36.4 C) (Oral)   Wt 192 lb (87.1 kg)   LMP 06/06/2015 (Approximate)   BMI 30.07 kg/m  General: Well Developed, well nourished, and in no acute distress.  Neuro/Psych: Alert and oriented x3, extra-ocular muscles intact, able to move all 4 extremities, sensation grossly intact. Skin: Warm and dry, no rashes noted.  Respiratory: Not using accessory muscles, speaking in full sentences, trachea midline.  Cardiovascular: Pulses palpable, no extremity edema. Abdomen: Does not appear distended. MSK: Right foot relatively normal-appearing. Small firm callus at the lateral to plantar fifth MTP with a slight umbilication indentation.  This is consistent with appearance with partially treated plantar wart.    Lab and Radiology Results No results found for this or any previous visit (from the past 72 hour(s)). No results found.     Assessment and Plan: 37 y.o. female with right foot plantar wart at plantar lateral fifth MTP.  Patient is successfully doing a good job with over-the-counter treatment.  Fortunately she is not very symptomatic.   Plan to continue over-the-counter treatment with salicylic acid pads.  If continuing to not improve or if worsening reasonable to consider excision.  Given the location I think she probably be in better hands with a podiatrist.  She will notify me if she needs further treatment and I will be happy to refer her to a podiatrist.   Additionally we had a lengthy discussion about her recent work-up and diagnosis regarding her paresthesias.  She is ultimately been diagnosed with small fiber neuropathy based on skin biopsy.  The discussion was not included in the time-based billing below.  I spent 15 minutes with this patient, greater than 50% was face-to-face time counseling regarding differential diagnosis, and treatment plan regarding the plantar wart.   Historical information moved to improve visibility of documentation.  Past Medical History:  Diagnosis Date  . Abnormal Pap smear of cervix 03-08-15   --normal pap:Pos HR HPV w/#16 Pos genotype--colposcopy ECC at least high grade dysplasia;cannot exclude adenocarcinoma insitu  . Anemia    d/t heavy menstrual cycles  . Anxiety   . Cancer ([place])    cervical pre cancerous cell  . Cervical scoliosis 02/04/2018  . Congenital nystagmus 06/18/2017  . Depression   . Dysmenorrhea   . GERD (gastroesophageal reflux disease) 06/23/2014  . Nystagmus    her whole life, sees [facility]   Past Surgical History:  Procedure Laterality Date  . ABDOMINAL HYSTERECTOMY  06-22-15   R-TLH w/Bil.Salpingectomy  . CERVICAL CONIZATION W/BX  N/A 04/27/2015   Procedure: CONIZATION CERVIX WITH BIOPSY with ECC;  Surgeon: Nunzio Cobbs, MD;  Location: [facility];  Service: Gynecology;  Laterality: N/A;  . CESAREAN SECTION     2009, 2006, and 2004  . CYSTOSCOPY N/A 06/22/2015   Procedure: CYSTOSCOPY;  Surgeon: Nunzio Cobbs, MD;  Location: [facility];  Service: Gynecology;  Laterality: N/A;  . WISDOM TOOTH EXTRACTION     Social History   Tobacco Use  .  Smoking status: Current Every Day Smoker    Packs/day: 1.00    Years: 15.00    Pack years: 15.00    Types: Cigarettes  . Smokeless tobacco: Never Used  Substance Use Topics  . Alcohol use: Not Currently    Alcohol/week: 6.0 standard drinks    Types: 1 Glasses of wine, 1 Cans of beer, 1 Shots of liquor, 3 Standard drinks or equivalent per week    Comment: once every couple of weeks    family history includes Cancer in her brother; Cancer (age of onset: 86) in her sister; Colon cancer in her father; Diabetes in her maternal grandmother and paternal grandfather; Hyperlipidemia in her mother; Stroke in her maternal grandfather.  Medications: Current Outpatient Medications  Medication Sig Dispense Refill  . DULoxetine (CYMBALTA) 30 MG capsule Take 1 capsule (30 mg total) by mouth daily. 30 capsule 1  . pregabalin (LYRICA) 75 MG capsule TAKE 1 CAPSULE BY MOUTH TWICE A DAY 28 capsule 0   No current facility-administered medications for this visit.    Allergies  Allergen Reactions  . Amoxicillin Hives    Not sure if ever had penicillin  . Coconut Flavor Swelling      Discussed warning signs or symptoms. Please see discharge instructions. Patient expresses understanding.

## 2018-08-08 DIAGNOSIS — Z808 Family history of malignant neoplasm of other organs or systems: Secondary | ICD-10-CM | POA: Diagnosis not present

## 2018-08-08 DIAGNOSIS — L814 Other melanin hyperpigmentation: Secondary | ICD-10-CM | POA: Diagnosis not present

## 2018-08-08 DIAGNOSIS — L84 Corns and callosities: Secondary | ICD-10-CM | POA: Diagnosis not present

## 2018-08-08 DIAGNOSIS — D2270 Melanocytic nevi of unspecified lower limb, including hip: Secondary | ICD-10-CM | POA: Diagnosis not present

## 2018-08-11 ENCOUNTER — Other Ambulatory Visit (HOSPITAL_COMMUNITY): Payer: Self-pay | Admitting: Psychiatry

## 2018-08-19 IMAGING — MR MR CERVICAL SPINE W/O CM
5 series · 34 of 48 positions shown · non-contrast
Comparison: Cervical spine radiographs 01/24/2018

CLINICAL DATA: Chronic back pain. Pain and numbness in the right
arm.

EXAM:
MRI CERVICAL SPINE WITHOUT CONTRAST
TECHNIQUE: Multiplanar, multisequence MR imaging of the cervical spine was
performed. No intravenous contrast was administered.

[Series 3: T1 · sagittal · 3.0mm · 0.70mm/px · 6 of 13 slices shown]
[im 1/13]
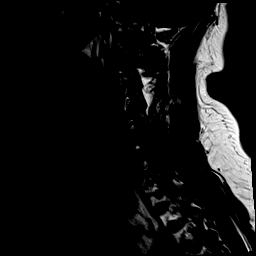
[im 3/13]
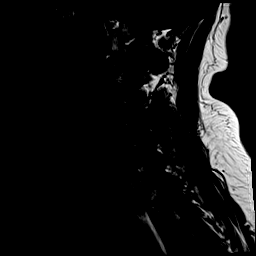
[im 5/13]
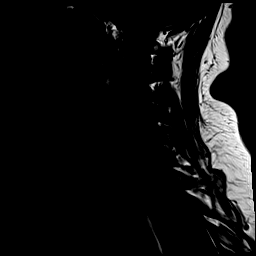
[im 8/13]
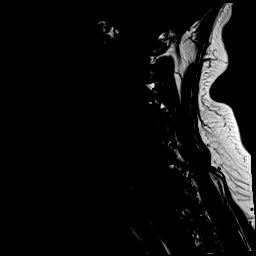
[im 10/13]
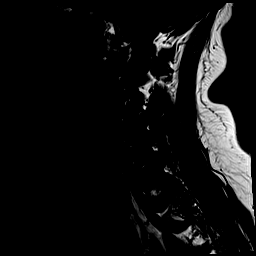
[im 13/13]
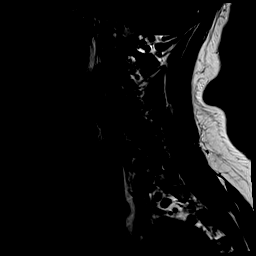

[Series 4: STIR · sagittal · 3.0mm · 0.35mm/px · 7 of 13 slices shown]
[im 1/13]
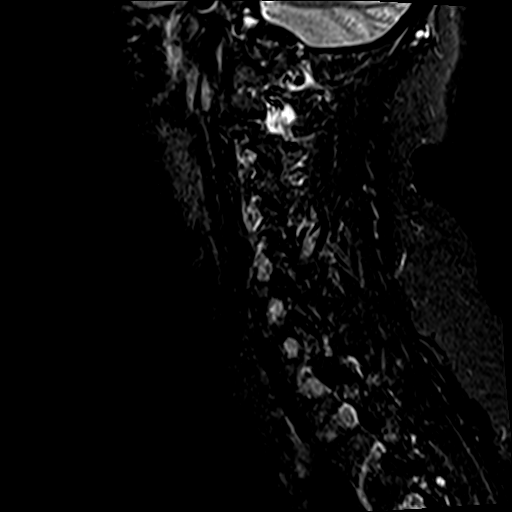
[im 3/13]
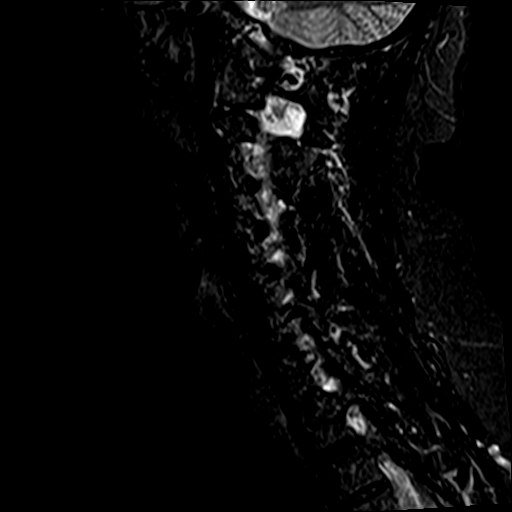
[im 5/13]
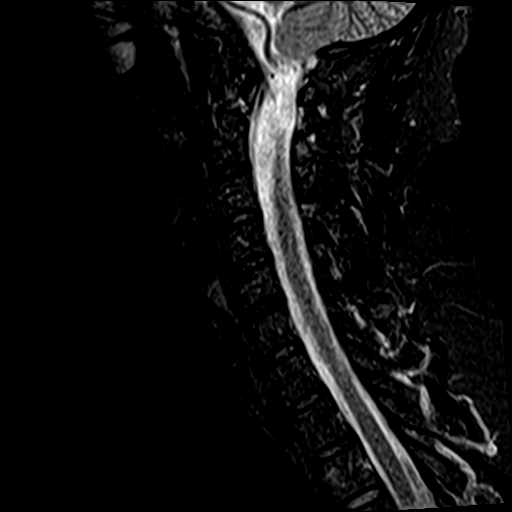
[im 7/13]
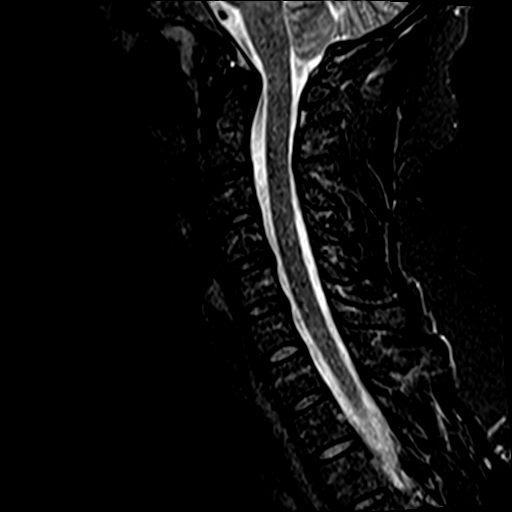
[im 9/13]
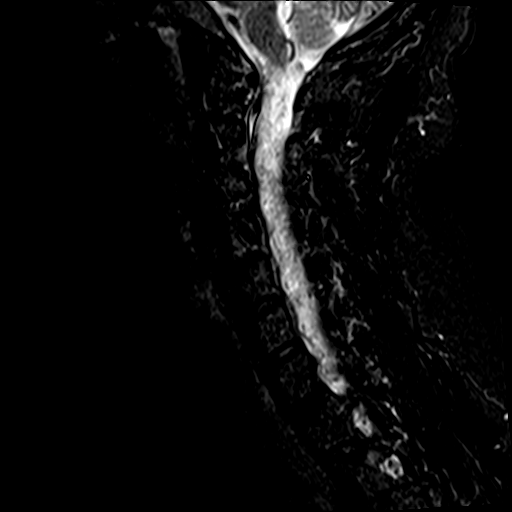
[im 11/13]
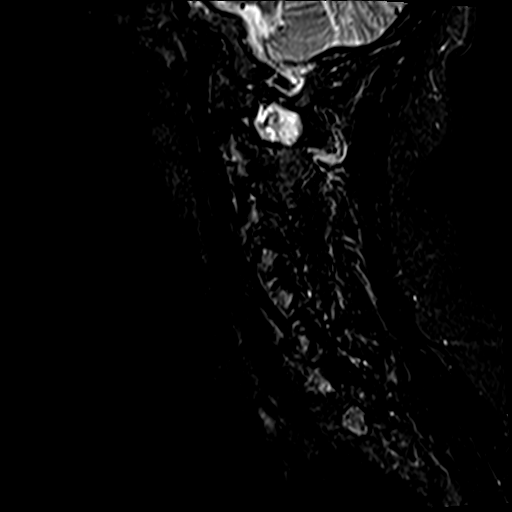
[im 13/13]
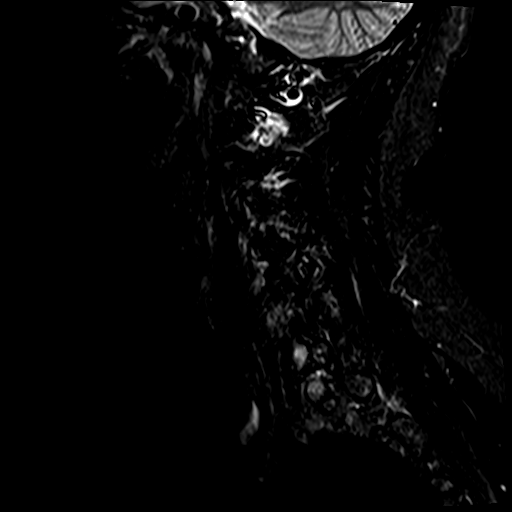

[Series 5: T2 · axial · 3.0mm · 0.62mm/px · z∈[-86,+5]mm · 8 of 27 slices shown (1 of 2)]
[im 1/27]
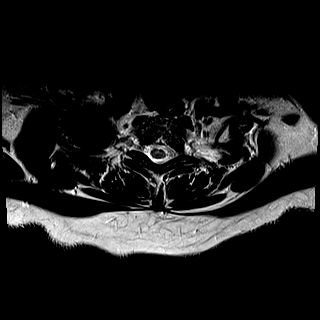
[im 5/27]
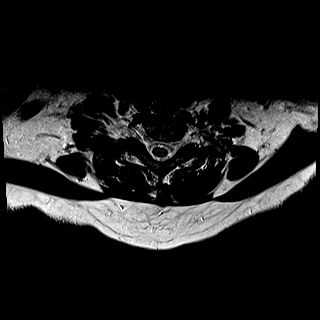
[im 9/27]
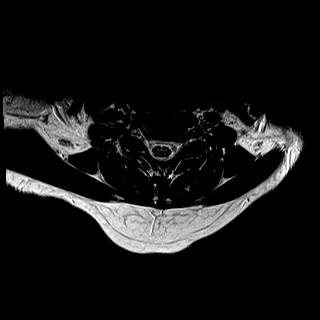
[im 13/27]
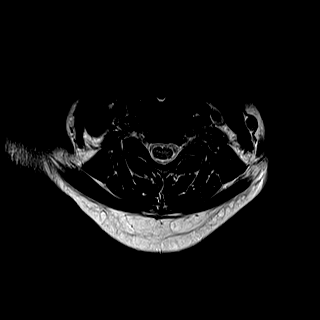
[im 15/27]
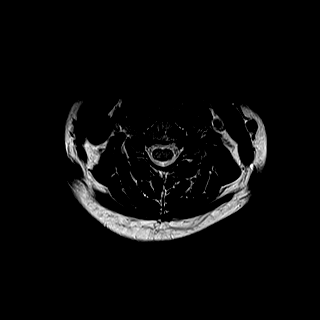
[im 19/27]
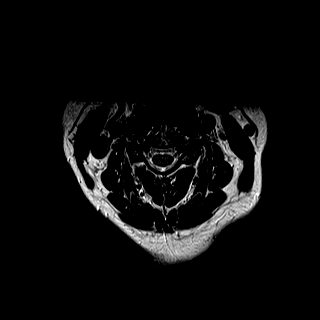
[im 23/27]
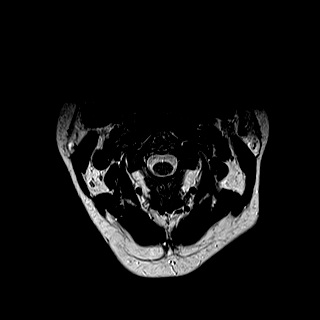
[im 27/27]
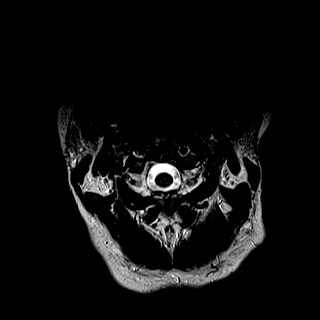

[Series 6: mpgr ax · axial · 3.0mm · 0.39mm/px · z∈[-76,-12]mm · 6 of 27 slices shown]
[im 1/27]
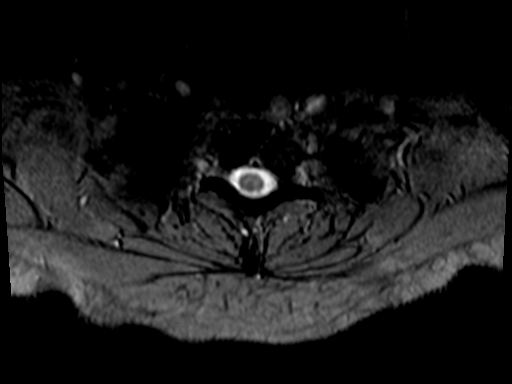
[im 5/27]
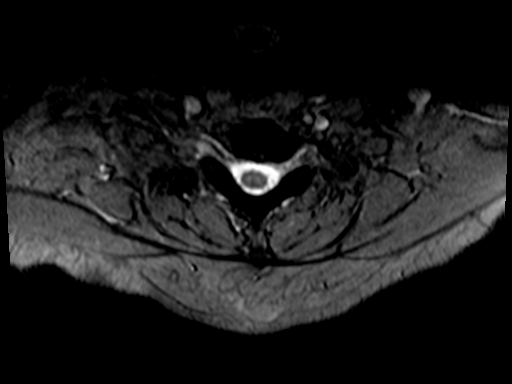
[im 9/27]
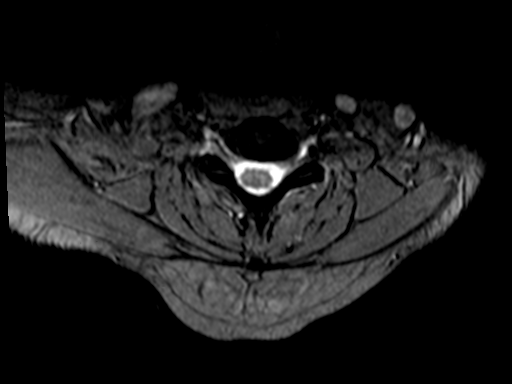
[im 13/27]
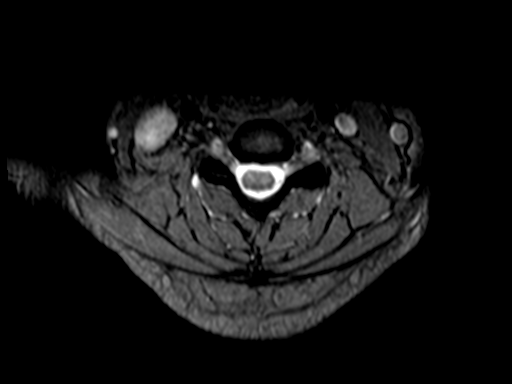
[im 15/27]
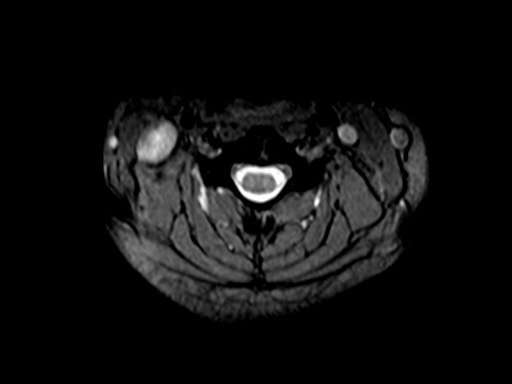
[im 19/27]
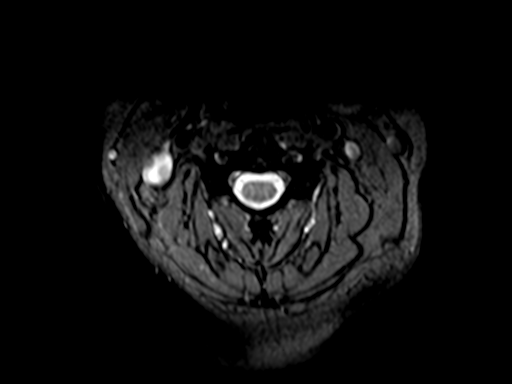

[Series 7: T2 · sagittal · 3.0mm · 0.56mm/px · 7 of 13 slices shown (2 of 2)]
[im 1/13]
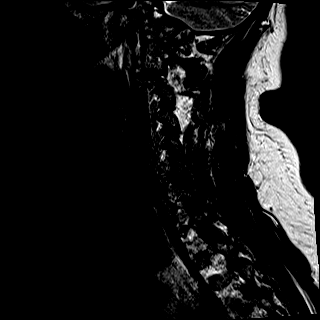
[im 3/13]
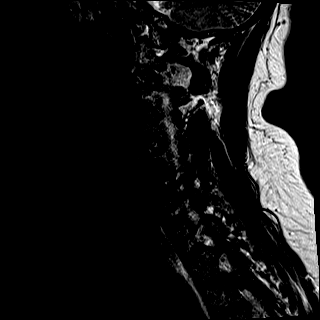
[im 5/13]
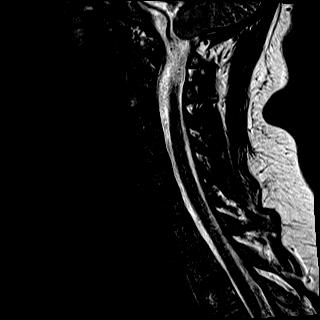
[im 7/13]
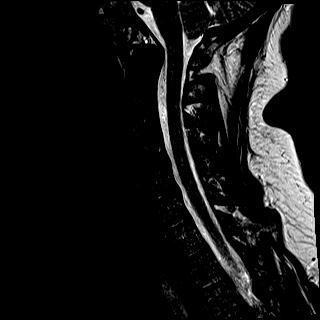
[im 9/13]
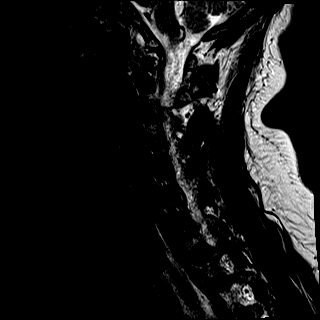
[im 11/13]
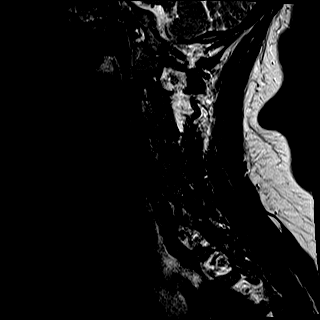
[im 13/13]
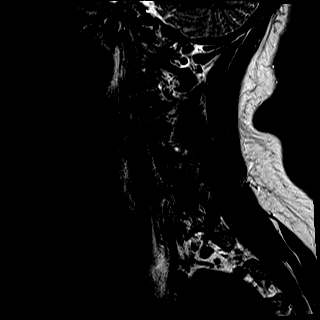

[34 of 48 positions shown; findings below may reference images not displayed]

FINDINGS: Alignment: Normal.

Vertebrae: No fracture, destructive osseous lesion, or evidence of
discitis. Suspected atypical hemangioma in the T2 vertebral body.

Cord: Normal signal and morphology.

Posterior Fossa, vertebral arteries, paraspinal tissues:
Unremarkable.

Disc levels:

C2-3: Mild left facet arthrosis without disc herniation or stenosis.

C3-4: Negative.

C4-5: Tiny central/left central disc protrusion and minimal left
facet arthrosis without stenosis.

C5-6: Negative.

C6-7: Negative.

C7-T1: Minimal facet arthrosis without disc herniation or stenosis.
IMPRESSION: Tiny C4-5 disc protrusion and mild multilevel facet arthrosis
without evidence of neural impingement.

## 2018-08-22 ENCOUNTER — Encounter: Payer: Self-pay | Admitting: Family Medicine

## 2018-08-28 ENCOUNTER — Other Ambulatory Visit: Payer: Self-pay | Admitting: Family Medicine

## 2018-08-28 ENCOUNTER — Other Ambulatory Visit: Payer: Self-pay | Admitting: Osteopathic Medicine

## 2018-08-28 DIAGNOSIS — M797 Fibromyalgia: Secondary | ICD-10-CM

## 2018-09-02 ENCOUNTER — Ambulatory Visit: Payer: Self-pay | Admitting: Diagnostic Neuroimaging

## 2018-09-02 ENCOUNTER — Encounter

## 2018-09-04 DIAGNOSIS — M3500 Sicca syndrome, unspecified: Secondary | ICD-10-CM | POA: Diagnosis not present

## 2018-09-04 DIAGNOSIS — G629 Polyneuropathy, unspecified: Secondary | ICD-10-CM | POA: Diagnosis not present

## 2018-09-04 DIAGNOSIS — M791 Myalgia, unspecified site: Secondary | ICD-10-CM | POA: Diagnosis not present

## 2018-09-05 DIAGNOSIS — G894 Chronic pain syndrome: Secondary | ICD-10-CM | POA: Diagnosis not present

## 2018-09-05 DIAGNOSIS — G608 Other hereditary and idiopathic neuropathies: Secondary | ICD-10-CM | POA: Diagnosis not present

## 2018-09-05 DIAGNOSIS — R1012 Left upper quadrant pain: Secondary | ICD-10-CM | POA: Diagnosis not present

## 2018-09-05 DIAGNOSIS — M47812 Spondylosis without myelopathy or radiculopathy, cervical region: Secondary | ICD-10-CM | POA: Diagnosis not present

## 2018-09-07 ENCOUNTER — Other Ambulatory Visit (HOSPITAL_COMMUNITY): Payer: Self-pay | Admitting: Psychiatry

## 2018-09-08 DIAGNOSIS — G894 Chronic pain syndrome: Secondary | ICD-10-CM | POA: Insufficient documentation

## 2018-09-08 DIAGNOSIS — R1012 Left upper quadrant pain: Secondary | ICD-10-CM | POA: Insufficient documentation

## 2018-09-08 DIAGNOSIS — G8929 Other chronic pain: Secondary | ICD-10-CM | POA: Insufficient documentation

## 2018-09-08 DIAGNOSIS — M47812 Spondylosis without myelopathy or radiculopathy, cervical region: Secondary | ICD-10-CM | POA: Insufficient documentation

## 2018-09-08 DIAGNOSIS — G608 Other hereditary and idiopathic neuropathies: Secondary | ICD-10-CM | POA: Insufficient documentation

## 2018-09-23 DIAGNOSIS — K5900 Constipation, unspecified: Secondary | ICD-10-CM | POA: Diagnosis not present

## 2018-09-23 DIAGNOSIS — R1012 Left upper quadrant pain: Secondary | ICD-10-CM | POA: Diagnosis not present

## 2018-09-23 DIAGNOSIS — R110 Nausea: Secondary | ICD-10-CM | POA: Diagnosis not present

## 2018-09-23 DIAGNOSIS — G629 Polyneuropathy, unspecified: Secondary | ICD-10-CM | POA: Insufficient documentation

## 2018-09-26 DIAGNOSIS — R1012 Left upper quadrant pain: Secondary | ICD-10-CM | POA: Diagnosis not present

## 2018-09-26 DIAGNOSIS — R1032 Left lower quadrant pain: Secondary | ICD-10-CM | POA: Diagnosis not present

## 2018-09-26 DIAGNOSIS — K5900 Constipation, unspecified: Secondary | ICD-10-CM | POA: Diagnosis not present

## 2018-09-26 DIAGNOSIS — R110 Nausea: Secondary | ICD-10-CM | POA: Diagnosis not present

## 2018-10-04 ENCOUNTER — Telehealth: Payer: Self-pay | Admitting: *Deleted

## 2018-10-04 ENCOUNTER — Ambulatory Visit: Payer: Self-pay | Admitting: Diagnostic Neuroimaging

## 2018-10-04 NOTE — Telephone Encounter (Signed)
Spoke with patient and apologized, informed her the provider is sick, need to reschedule her appointment. Rescheduled for next Friday. She verbalized understanding.

## 2018-10-10 ENCOUNTER — Telehealth: Payer: Self-pay | Admitting: *Deleted

## 2018-10-10 NOTE — Telephone Encounter (Signed)
LVM advising patient we are closing tomorrow due to bad weather, need to reschedule her appointment. Advised she call today before 12 noon to reschedule for next Wed afternoon.

## 2018-10-11 ENCOUNTER — Ambulatory Visit: Payer: Self-pay | Admitting: Diagnostic Neuroimaging

## 2018-10-29 ENCOUNTER — Encounter: Payer: Self-pay | Admitting: Diagnostic Neuroimaging

## 2018-10-29 ENCOUNTER — Ambulatory Visit (INDEPENDENT_AMBULATORY_CARE_PROVIDER_SITE_OTHER): Payer: BLUE CROSS/BLUE SHIELD | Admitting: Diagnostic Neuroimaging

## 2018-10-29 VITALS — BP 105/71 | HR 89 | Ht 67.0 in | Wt 182.8 lb

## 2018-10-29 DIAGNOSIS — G609 Hereditary and idiopathic neuropathy, unspecified: Secondary | ICD-10-CM

## 2018-10-29 NOTE — Progress Notes (Signed)
[facility]  PATIENT: Tara Velazquez DOB: 06-13-1981  REFERRING CLINICIAN: Steva Colder HISTORY FROM: patient  REASON FOR VISIT: new consult    HISTORICAL  CHIEF COMPLAINT:  Chief Complaint  Patient presents with  . Paresthesia    rm 7, New Pt, "numbness in feet x 6 years, now generalized over body"     HISTORY OF PRESENT ILLNESS:   38 year old female here for evaluation of small fiber neuropathy.  2014 patient noticed numbness in her feet.  Over time she noticed additional symptoms including left arm pain and numbness, depression, dry mouth, smell sensitivity, headaches, rash, brittle nails, chest pain, skin twitching, blurred vision, dizziness, apnea, excessive daytime sleepiness.  Patient has had neurology evaluation, rheumatology evaluation, pain management evaluation.  EMG nerve conduction study was normal.  Extensive lab testing for neuropathy causes was negative.  Lumbar puncture was negative.  MRI of the brain, cervical spine, lumbar spine was normal.  She had a skin biopsy which showed decreased nerve fiber density, therefore raising possibility of small fiber neuropathy.  No specific cause of been found.  She has tried Lyrica and Cymbalta with mild relief.  She is weaning off of Cymbalta due to abnormal side effects.  Patient presenting here for second opinion.    REVIEW OF SYSTEMS: Full 14 system review of systems performed and negative with exception of: Headache numbness snoring dizziness depression anxiety  ALLERGIES: Allergies  Allergen Reactions  . Coconut Oil Anaphylaxis  . Amoxicillin Hives    Not sure if ever had penicillin  . Coconut Flavor Swelling    HOME MEDICATIONS: Outpatient Medications Prior to Visit  Medication Sig Dispense Refill  . DULoxetine (CYMBALTA) 30 MG capsule TAKE 1 CAPSULE BY MOUTH EVERY DAY 30 capsule 0  . pregabalin (LYRICA) 75 MG capsule TAKE 1 CAPSULE BY MOUTH TWICE A DAY 60 capsule 3   No facility-administered  medications prior to visit.     PAST MEDICAL HISTORY: Past Medical History:  Diagnosis Date  . Abnormal Pap smear of cervix 03-08-15   --normal pap:Pos HR HPV w/#16 Pos genotype--colposcopy ECC at least high grade dysplasia;cannot exclude adenocarcinoma insitu  . Anemia    d/t heavy menstrual cycles  . Anxiety   . Cancer ([place])    cervical pre cancerous cell  . Cervical scoliosis 02/04/2018  . Congenital nystagmus 06/18/2017  . Depression   . Dysmenorrhea   . GERD (gastroesophageal reflux disease) 06/23/2014  . Nystagmus    her whole life, sees [facility]  . Nystagmus   . Small fiber neuropathy     PAST SURGICAL HISTORY: Past Surgical History:  Procedure Laterality Date  . ABDOMINAL HYSTERECTOMY  2018   R-TLH w/Bil.Salpingectomy  . CERVICAL CONIZATION W/BX N/A 04/27/2015   Procedure: CONIZATION CERVIX WITH BIOPSY with ECC;  Surgeon: Nunzio Cobbs, MD;  Location: [facility];  Service: Gynecology;  Laterality: N/A;  . CESAREAN SECTION     2009, 2006, and 2004  . CYSTOSCOPY N/A 06/22/2015   Procedure: CYSTOSCOPY;  Surgeon: Nunzio Cobbs, MD;  Location: [facility];  Service: Gynecology;  Laterality: N/A;  . WISDOM TOOTH EXTRACTION      FAMILY HISTORY: Family History  Problem Relation Age of Onset  . Colon cancer Father   . Cancer Sister 45       Dec from MVA--but had hysterectomy age 72 ?CA (pt. states mom told her -- sister dx'd with germ induced ovarian ca)  . Hyperlipidemia Mother   .  Diabetes Maternal Grandmother   . Diabetes Paternal Grandfather   . Stroke Maternal Grandfather     SOCIAL HISTORY: Social History   Socioeconomic History  . Marital status: Married    Spouse name: Not on file  . Number of children: 3  . Years of education: Not on file  . Highest education level: Associate degree: academic program  Occupational History    Comment: NA  Social Needs  . Financial resource strain: Not on file  . Food insecurity:    Worry: Not on file     Inability: Not on file  . Transportation needs:    Medical: Not on file    Non-medical: Not on file  Tobacco Use  . Smoking status: Current Every Day Smoker    Packs/day: 1.00    Years: 15.00    Pack years: 15.00    Types: Cigarettes  . Smokeless tobacco: Never Used  Substance and Sexual Activity  . Alcohol use: Not Currently    Alcohol/week: 6.0 standard drinks    Types: 1 Glasses of wine, 1 Cans of beer, 1 Shots of liquor, 3 Standard drinks or equivalent per week    Comment: quit 03/2018  . Drug use: No  . Sexual activity: Yes    Partners: Male    Birth control/protection: Surgical    Comment: Tubal/Hysterectomy  Lifestyle  . Physical activity:    Days per week: Not on file    Minutes per session: Not on file  . Stress: Not on file  Relationships  . Social connections:    Talks on phone: Not on file    Gets together: Not on file    Attends religious service: Not on file    Active member of club or organization: Not on file    Attends meetings of clubs or organizations: Not on file    Relationship status: Not on file  . Intimate partner violence:    Fear of current or ex partner: Not on file    Emotionally abused: Not on file    Physically abused: Not on file    Forced sexual activity: Not on file  Other Topics Concern  . Not on file  Social History Narrative   Work or School: homemaker      Home Situation: lives with husband and 3 children      Spiritual Beliefs: none      Lifestyle: walks/jogs 2 times per week/ diet is poor   Caffeine- coffee 6 cups daily     PHYSICAL EXAM  GENERAL EXAM/CONSTITUTIONAL: Vitals:  Vitals:   10/29/18 1354  BP: 105/71  Pulse: 89  Weight: 182 lb 12.8 oz (82.9 kg)  Height: 5\' 7"  (1.702 m)     Body mass index is 28.63 kg/m. Wt Readings from Last 3 Encounters:  10/29/18 182 lb 12.8 oz (82.9 kg)  07/26/18 192 lb (87.1 kg)  03/21/18 181 lb 3.2 oz (82.2 kg)     Patient is in no distress; well developed, nourished  and groomed; neck is supple  CARDIOVASCULAR:  Examination of carotid arteries is normal; no carotid bruits  Regular rate and rhythm, no murmurs  Examination of peripheral vascular system by observation and palpation is normal  EYES:  Ophthalmoscopic exam of optic discs and posterior segments is normal; no papilledema or hemorrhages  Visual Acuity Screening   Right eye Left eye Both eyes  Without correction:     With correction: 20/50 20/50   Comments: contacts    MUSCULOSKELETAL:  Gait,  strength, tone, movements noted in Neurologic exam below  NEUROLOGIC: MENTAL STATUS:  No flowsheet data found.  awake, alert, oriented to person, place and time  recent and remote memory intact  normal attention and concentration  language fluent, comprehension intact, naming intact  fund of knowledge appropriate  CRANIAL NERVE:   2nd - no papilledema on fundoscopic exam  2nd, 3rd, 4th, 6th - pupils equal and reactive to light, visual fields full to confrontation, extraocular muscles intact, no nystagmus  5th - facial sensation symmetric  7th - facial strength symmetric  8th - hearing intact  9th - palate elevates symmetrically, uvula midline  11th - shoulder shrug symmetric  12th - tongue protrusion midline  MOTOR:   normal bulk and tone, full strength in the BUE, BLE; SLIGHTLY LIMITED DUE TO PAIN IN UPPER AND LOWER EXT  SENSORY:   normal and symmetric to light touch; DECR PP IN FACE, ARMS AND LEGS  COORDINATION:   finger-nose-finger, fine finger movements normal  REFLEXES:   deep tendon reflexes TRACE and symmetric  GAIT/STATION:   narrow based gait; romberg is negative    DIAGNOSTIC DATA (LABS, IMAGING, TESTING) - I reviewed patient records, labs, notes, testing and imaging myself where available.  Lab Results  Component Value Date   WBC 5.8 03/06/2018   HGB 14.6 03/06/2018   HCT 43.3 03/06/2018   MCV 92.9 03/06/2018   PLT 193 03/06/2018        Component Value Date/Time   NA 139 03/06/2018 1150   K 4.2 03/06/2018 1150   CL 108 03/06/2018 1150   CO2 25 03/06/2018 1150   GLUCOSE 84 03/06/2018 1150   BUN 14 03/06/2018 1150   CREATININE 0.80 03/06/2018 1150   CALCIUM 8.9 03/06/2018 1150   PROT 6.4 03/06/2018 1150   PROT 6.5 06/21/2017   ALBUMIN 3.8 06/21/2017   AST 12 03/06/2018 1150   ALT 8 03/06/2018 1150   ALKPHOS 47 09/29/2016 1115   BILITOT 0.6 03/06/2018 1150   GFRNONAA >89 09/29/2016 1115   GFRAA >89 09/29/2016 1115   Lab Results  Component Value Date   CHOL 155 09/29/2016   HDL 46 (L) 09/29/2016   LDLCALC 93 09/29/2016   TRIG 78 09/29/2016   CHOLHDL 3.4 09/29/2016   Lab Results  Component Value Date   HGBA1C 4.8 06/25/2014   Lab Results  Component Value Date   [id number] 062 03/06/2018   Lab Results  Component Value Date   TSH 1.03 03/06/2018    09/04/18 SSA, SSB, RA, CCP, ANA - negative  07/08/18 lumbar puncture - WBC 3, RBC 12, OCB zero, protein 31, glucose 63  06/18/17 B12 - 605  07/03/18 skin biopsy - decreased nerve fiber density  03/25/18 MRI brain [I reviewed images myself and agree with interpretation. -VRP]  - Negative exam. No evidence of demyelinating disease or other significant pathology. - Partial empty sella, incidental finding.  03/04/18 MRI cervical [I reviewed images myself and agree with interpretation. -VRP]  - Tiny C4-5 disc protrusion and mild multilevel facet arthrosis without evidence of neural impingement.  02/11/18 MRI lumbar spine [I reviewed images myself and agree with interpretation. -VRP]  - No significant lumbar spine disc protrusion, foraminal stenosis or central canal stenosis. No acute osseous injury of the lumbar spine.   ASSESSMENT AND PLAN  38 y.o. year old female here with:  Dx:  1. Idiopathic small fiber peripheral neuropathy     PLAN:  - small fiber neuropathy --> extensive workup has been  negative so far - [facility] - will refer for genetic neuropathy testing ([place])  Return for return to PCP, pending if symptoms worsen or fail to improve.    Penni Bombard, MD 9/71/8209, 9:06 PM Certified in Neurology, Neurophysiology and Neuroimaging  [facility] [address], [place], [place] [address] [phone]

## 2018-10-29 NOTE — Progress Notes (Signed)
Lab specimen for [facility] lab ready at front desk for [employer] pick up. Pick up # [id number], tracking # [id number].  Lab confirmation code: [id number].

## 2018-11-07 ENCOUNTER — Telehealth: Payer: Self-pay | Admitting: *Deleted

## 2018-11-07 NOTE — Telephone Encounter (Signed)
[facility] lab results received, placed on Dr [employer] desk for review.

## 2018-11-11 NOTE — Telephone Encounter (Signed)
Negative testing. -VRP

## 2018-11-12 NOTE — Telephone Encounter (Signed)
LVM informing patient her [facility] labs are negative, no genetic cause found for neuropathy. Advised she return to PCP, monitor symptoms, call for any questions, concerns.

## 2019-02-03 DIAGNOSIS — Z03818 Encounter for observation for suspected exposure to other biological agents ruled out: Secondary | ICD-10-CM | POA: Diagnosis not present

## 2019-02-12 DIAGNOSIS — R1012 Left upper quadrant pain: Secondary | ICD-10-CM | POA: Diagnosis not present

## 2019-02-12 DIAGNOSIS — R140 Abdominal distension (gaseous): Secondary | ICD-10-CM | POA: Diagnosis not present

## 2019-02-12 DIAGNOSIS — K581 Irritable bowel syndrome with constipation: Secondary | ICD-10-CM | POA: Diagnosis not present

## 2019-02-25 DIAGNOSIS — Z20828 Contact with and (suspected) exposure to other viral communicable diseases: Secondary | ICD-10-CM | POA: Diagnosis not present

## 2019-03-05 DIAGNOSIS — G4733 Obstructive sleep apnea (adult) (pediatric): Secondary | ICD-10-CM | POA: Diagnosis not present

## 2019-03-05 DIAGNOSIS — R0683 Snoring: Secondary | ICD-10-CM | POA: Diagnosis not present

## 2019-03-05 DIAGNOSIS — G4719 Other hypersomnia: Secondary | ICD-10-CM | POA: Diagnosis not present

## 2019-03-05 DIAGNOSIS — F515 Nightmare disorder: Secondary | ICD-10-CM | POA: Diagnosis not present

## 2019-03-10 ENCOUNTER — Encounter: Payer: Self-pay | Admitting: Osteopathic Medicine

## 2019-03-13 DIAGNOSIS — F3181 Bipolar II disorder: Secondary | ICD-10-CM | POA: Diagnosis not present

## 2019-03-13 DIAGNOSIS — F411 Generalized anxiety disorder: Secondary | ICD-10-CM | POA: Diagnosis not present

## 2019-03-14 DIAGNOSIS — F515 Nightmare disorder: Secondary | ICD-10-CM | POA: Diagnosis not present

## 2019-03-14 DIAGNOSIS — R0683 Snoring: Secondary | ICD-10-CM | POA: Diagnosis not present

## 2019-03-14 DIAGNOSIS — G4719 Other hypersomnia: Secondary | ICD-10-CM | POA: Diagnosis not present

## 2019-03-14 DIAGNOSIS — G4733 Obstructive sleep apnea (adult) (pediatric): Secondary | ICD-10-CM | POA: Diagnosis not present

## 2019-03-20 ENCOUNTER — Encounter: Payer: Self-pay | Admitting: Osteopathic Medicine

## 2019-03-20 ENCOUNTER — Ambulatory Visit (INDEPENDENT_AMBULATORY_CARE_PROVIDER_SITE_OTHER): Payer: BC Managed Care – PPO | Admitting: Osteopathic Medicine

## 2019-03-20 ENCOUNTER — Other Ambulatory Visit: Payer: Self-pay

## 2019-03-20 VITALS — BP 101/68 | HR 75 | Ht 67.0 in | Wt 177.0 lb

## 2019-03-20 DIAGNOSIS — F17200 Nicotine dependence, unspecified, uncomplicated: Secondary | ICD-10-CM

## 2019-03-20 DIAGNOSIS — Z0000 Encounter for general adult medical examination without abnormal findings: Secondary | ICD-10-CM | POA: Diagnosis not present

## 2019-03-20 DIAGNOSIS — R682 Dry mouth, unspecified: Secondary | ICD-10-CM

## 2019-03-20 DIAGNOSIS — R42 Dizziness and giddiness: Secondary | ICD-10-CM

## 2019-03-20 DIAGNOSIS — M797 Fibromyalgia: Secondary | ICD-10-CM | POA: Diagnosis not present

## 2019-03-20 NOTE — Progress Notes (Signed)
HPI: Tara Velazquez is a 38 y.o. female who  has a past medical history of Abnormal Pap smear of cervix (03-08-15), Anemia, Anxiety, Cancer ([place]), Cervical scoliosis (02/04/2018), Congenital nystagmus (06/18/2017), Depression, Dysmenorrhea, GERD (gastroesophageal reflux disease) (06/23/2014), Nystagmus, Nystagmus, and Small fiber neuropathy.  she presents to [facility] today, 03/20/19,  for chief complaint of: Physical     Patient here for annual physical / wellness exam.  See preventive care reviewed as below.  Recent labs reviewed in detail with the patient.   Additional concerns today include:   Occasional lightheadedness when standing up.   Dry mouth on and off has been bothering her     Past medical, surgical, social and family history reviewed:  Patient Active Problem List   Diagnosis Date Noted   Cervical scoliosis 02/04/2018   Congenital nystagmus 06/18/2017   Chest pain 10/04/2016   Right-sided chest wall pain 10/04/2016   Anxiety and depression 10/04/2016   History of bipolar disorder 10/04/2016   Fatigue 10/04/2016   Agoraphobia 10/04/2016   Status post laparoscopic hysterectomy 06/22/2015   Elevated LFTs 06/23/2014   Fibrocystic breast 06/23/2014   GERD (gastroesophageal reflux disease) 06/23/2014    Past Surgical History:  Procedure Laterality Date   ABDOMINAL HYSTERECTOMY  2018   R-TLH w/Bil.Salpingectomy   CERVICAL CONIZATION W/BX N/A 04/27/2015   Procedure: CONIZATION CERVIX WITH BIOPSY with ECC;  Surgeon: Nunzio Cobbs, MD;  Location: [facility];  Service: Gynecology;  Laterality: N/A;   CESAREAN SECTION     2009, 2006, and 2004   CYSTOSCOPY N/A 06/22/2015   Procedure: CYSTOSCOPY;  Surgeon: Nunzio Cobbs, MD;  Location: [facility];  Service: Gynecology;  Laterality: N/A;   WISDOM TOOTH EXTRACTION      Social History   Tobacco Use   Smoking status: Current Every Day Smoker   Packs/day: 1.00    Years: 15.00    Pack years: 15.00    Types: Cigarettes   Smokeless tobacco: Never Used  Substance Use Topics   Alcohol use: Not Currently    Alcohol/week: 6.0 standard drinks    Types: 1 Glasses of wine, 1 Cans of beer, 1 Shots of liquor, 3 Standard drinks or equivalent per week    Comment: quit 03/2018    Family History  Problem Relation Age of Onset   Colon cancer Father    Cancer Sister 14       Dec from MVA--but had hysterectomy age 28 ?CA (pt. states mom told her -- sister dx'd with germ induced ovarian ca)   Hyperlipidemia Mother    Diabetes Maternal Grandmother    Diabetes Paternal Grandfather    Stroke Maternal Grandfather      Current medication list and allergy/intolerance information reviewed:    Current Outpatient Medications  Medication Sig Dispense Refill   ARIPiprazole (ABILIFY) 5 MG tablet Take by mouth.     linaclotide (LINZESS) 145 MCG CAPS capsule Take by mouth.     pregabalin (LYRICA) 75 MG capsule TAKE 1 CAPSULE BY MOUTH TWICE A DAY 60 capsule 3   No current facility-administered medications for this visit.     Allergies  Allergen Reactions   Coconut Oil Anaphylaxis   Amoxicillin Hives    Not sure if ever had penicillin   Coconut Flavor Swelling      Review of Systems:  Constitutional:  No  fever, no chills, No recent illness, No unintentional weight changes. No significant fatigue.   HEENT: No  headache, no vision change, no hearing change, No sore throat, No  sinus pressure  Cardiac: No  chest pain, No  pressure, No palpitations, No  Orthopnea  Respiratory:  No  shortness of breath. No  Cough  Gastrointestinal: No  abdominal pain, No  nausea, No  vomiting,  No  blood in stool, No  diarrhea, No  constipation   Musculoskeletal: No new myalgia/arthralgia  Skin: No  Rash, No other wounds/concerning lesions  Genitourinary: No  incontinence, No  abnormal genital bleeding, No abnormal genital  discharge  Hem/Onc: No  easy bruising/bleeding, No  abnormal lymph node  Endocrine: No cold intolerance,  No heat intolerance. No polyuria/polydipsia/polyphagia   Neurologic: No  weakness, +dizziness, No  slurred speech/focal weakness/facial droop  Psychiatric: No  concerns with depression, No  concerns with anxiety, No sleep problems, No mood problems  Exam:  BP 101/68    Pulse 75    Ht 5\' 7"  (1.702 m)    Wt 177 lb (80.3 kg)    LMP 06/06/2015 (Approximate)    BMI 27.72 kg/m   Constitutional: VS see above. General Appearance: alert, well-developed, well-nourished, NAD  Eyes: Normal lids and conjunctive, non-icteric sclera  Neck: No masses, trachea midline. No thyroid enlargement. No tenderness/mass appreciated. No lymphadenopathy  Respiratory: Normal respiratory effort. no wheeze, no rhonchi, no rales  Cardiovascular: S1/S2 normal, no murmur, no rub/gallop auscultated. RRR. No lower extremity edema.   Gastrointestinal: Nontender, no masses. No hepatomegaly, no splenomegaly. No hernia appreciated. Bowel sounds normal. Rectal exam deferred.   Musculoskeletal: Gait normal. No clubbing/cyanosis of digits.   Neurological: Normal balance/coordination. No tremor. No cranial nerve deficit on limited exam. Motor and sensation intact and symmetric. Cerebellar reflexes intact.   Skin: warm, dry, intact. No rash/ulcer. No concerning nevi or subq nodules on limited exam.    Psychiatric: Normal judgment/insight. Normal mood and affect. Oriented x3.    No results found for this or any previous visit (from the past 72 hour(s)).  No results found.        ASSESSMENT/PLAN: The primary encounter diagnosis was Annual physical exam. Diagnoses of Dry mouth, Tobacco dependence, Fibromyalgia, and Orthostatic lightheadedness were also pertinent to this visit.  OK to continue Lyrica (pt repots her neurologist has left his up to PCP...), but this may be contributing to orthostatic symptoms. I  think would be ok to d/c this and see if sypmtoms improve      Orders Placed This Encounter  Procedures   CBC   COMPLETE METABOLIC PANEL WITH GFR   Lipid panel   TSH   Urinalysis, Routine w reflex microscopic    No orders of the defined types were placed in this encounter.   Patient Instructions  General Preventive Care  Most recent routine screening lipids/other labs: ordered today   Everyone should have blood pressure checked once per year.   Tobacco: Please let me know if you need help quitting!  Alcohol: don't!  Exercise: as tolerated to reduce risk of cardiovascular disease and diabetes. Strength training will also prevent osteoporosis.   Mental health: if need for mental health care (medicines, counseling, other), or concerns about moods, please let me know!   Sexual health: if need for STD testing, or if concerns with libido/pain problems, please let me know! If you need to discuss your birth control options, please let me know!   Advanced Directive: Living Will and/or Healthcare Power of Attorney recommended for all adults, regardless of age or health.  Vaccines  Flu  vaccine: recommended for almost everyone, every fall.   Shingles vaccine: Shingrix recommended after age 69.  Pneumonia vaccines: Prevnar and Pneumovax recommended after age 59, or sooner if certain medical conditions.  Tetanus booster: Tdap recommended every 10 years.  Cancer screenings   Colon cancer screening: recommended for everyone at age 89  Breast cancer screening: mammogram recommended at age 60 every other year at least, and annually after age 82.   Cervical cancer screening: Pap every 1 to 5 years depending on age and other risk factors. Can usually stop at age 62 or w/ hysterectomy.   Lung cancer screening: CT chest every year for those age 50 to 39 years old with ?30 pack year smoking history, who either currently smoke or have quit within the past 15 years. Infection  screenings  HIV: recommended screening at least once age 40-65, more often as needed.  Gonorrhea/Chlamydia: screening as needed  Hepatitis C: recommended for anyone born [id number]  TB: certain at-risk populations, or depending on work requirements and/or travel history Other  Bone Density Test: recommended for women at age 61, or after 27 for smokers.              Orthostatic Hypotension Blood pressure is a measurement of how strongly, or weakly, your blood is pressing against the walls of your arteries. Orthostatic hypotension is a sudden drop in blood pressure that happens when you quickly change positions, such as when you get up from sitting or lying down. Arteries are blood vessels that carry blood from your heart throughout your body. When blood pressure is too low, you may not get enough blood to your brain or to the rest of your organs. This can cause weakness, light-headedness, rapid heartbeat, and fainting. This can last for just a few seconds or for up to a few minutes. Orthostatic hypotension is usually not a serious problem. However, if it happens frequently or gets worse, it may be a sign of something more serious. What are the causes? This condition may be caused by:  Sudden changes in posture, such as standing up quickly after you have been sitting or lying down.  Blood loss.  Loss of body fluids (dehydration).  Heart problems.  Hormone (endocrine) problems.  Pregnancy.  Severe infection.  Lack of certain nutrients.  Severe allergic reactions (anaphylaxis).  Certain medicines, such as blood pressure medicine or medicines that make the body lose excess fluids (diuretics). Sometimes, this condition can be caused by not taking medicine as directed, such as taking too much of a certain medicine. What increases the risk? The following factors may make you more likely to develop this condition:  Age. Risk increases as you get older.  Conditions that  affect the heart or the central nervous system.  Taking certain medicines, such as blood pressure medicine or diuretics.  Being pregnant. What are the signs or symptoms? Symptoms of this condition may include:  Weakness.  Light-headedness.  Dizziness.  Blurred vision.  Fatigue.  Rapid heartbeat.  Fainting, in severe cases. How is this diagnosed? This condition is diagnosed based on:  Your medical history.  Your symptoms.  Your blood pressure measurement. Your health care provider will check your blood pressure when you are: ? Lying down. ? Sitting. ? Standing. A blood pressure reading is recorded as two numbers, such as "120 over 80" (or 120/80). The first ("top") number is called the systolic pressure. It is a measure of the pressure in your arteries as your heart beats. The second ("  bottom") number is called the diastolic pressure. It is a measure of the pressure in your arteries when your heart relaxes between beats. Blood pressure is measured in a unit called mm Hg. Healthy blood pressure for most adults is 120/80. If your blood pressure is below 90/60, you may be diagnosed with hypotension. Other information or tests that may be used to diagnose orthostatic hypotension include:  Your other vital signs, such as your heart rate and temperature.  Blood tests.  Tilt table test. For this test, you will be safely secured to a table that moves you from a lying position to an upright position. Your heart rhythm and blood pressure will be monitored during the test. How is this treated? This condition may be treated by:  Changing your diet. This may involve eating more salt (sodium) or drinking more water.  Taking medicines to raise your blood pressure.  Changing the dosage of certain medicines you are taking that might be lowering your blood pressure.  Wearing compression stockings. These stockings help to prevent blood clots and reduce swelling in your legs. In some  cases, you may need to go to the hospital for:  Fluid replacement. This means you will receive fluids through an IV.  Blood replacement. This means you will receive donated blood through an IV (transfusion).  Treating an infection or heart problems, if this applies.  Monitoring. You may need to be monitored while medicines that you are taking wear off. Follow these instructions at home: Eating and drinking   Drink enough fluid to keep your urine pale yellow.  Eat a healthy diet, and follow instructions from your health care provider about eating or drinking restrictions. A healthy diet includes: ? Fresh fruits and vegetables. ? Whole grains. ? Lean meats. ? Low-fat dairy products.  Eat extra salt only as directed. Do not add extra salt to your diet unless your health care provider told you to do that.  Eat frequent, small meals.  Avoid standing up suddenly after eating. Medicines  Take over-the-counter and prescription medicines only as told by your health care provider. ? Follow instructions from your health care provider about changing the dosage of your current medicines, if this applies. ? Do not stop or adjust any of your medicines on your own. General instructions   Wear compression stockings as told by your health care provider.  Get up slowly from lying down or sitting positions. This gives your blood pressure a chance to adjust.  Avoid hot showers and excessive heat as directed by your health care provider.  Return to your normal activities as told by your health care provider. Ask your health care provider what activities are safe for you.  Do not use any products that contain nicotine or tobacco, such as cigarettes, e-cigarettes, and chewing tobacco. If you need help quitting, ask your health care provider.  Keep all follow-up visits as told by your health care provider. This is important. Contact a health care provider if you:  Vomit.  Have  diarrhea.  Have a fever for more than 2-3 days.  Feel more thirsty than usual.  Feel weak and tired. Get help right away if you:  Have chest pain.  Have a fast or irregular heartbeat.  Develop numbness in any part of your body.  Cannot move your arms or your legs.  Have trouble speaking.  Become sweaty or feel light-headed.  Faint.  Feel short of breath.  Have trouble staying awake.  Feel confused. Summary  Orthostatic hypotension is a sudden drop in blood pressure that happens when you quickly change positions.  Orthostatic hypotension is usually not a serious problem.  It is diagnosed by having your blood pressure taken lying down, sitting, and then standing.  It may be treated by changing your diet or adjusting your medicines. This information is not intended to replace advice given to you by your health care provider. Make sure you discuss any questions you have with your health care provider. Document Released: 07/28/2002 Document Revised: 01/31/2018 Document Reviewed: 01/31/2018 Elsevier Patient Education  2020 [employer].               Visit summary with medication list and pertinent instructions was printed for patient to review. All questions at time of visit were answered - patient instructed to contact office with any additional concerns or updates. ER/RTC precautions were reviewed with the patient.     Please note: voice recognition software was used to produce this document, and typos may escape review. Please contact Dr. Sheppard Coil for any needed clarifications.     Follow-up plan: Return in about 1 year (around 03/19/2020) for [place] (call week prior to visit for lab orders).

## 2019-03-20 NOTE — Progress Notes (Signed)
38 y.o. [id number] Married Caucasian female here for annual exam.    Patient has possible diagnosis of IBS-C and post orthotic hypotension? Patient is have sleep study done. She is searching for answers to explain her symptoms.   PCP:  Emeterio Reeve, DO   Patient's last menstrual period was 06/06/2015 (approximate).           Sexually active: Yes.    The current method of family planning is status post hysterectomy.    Exercising: Yes.    walks daily. Smoker:  no  Health Maintenance: Pap: 03-21-18 Neg:Neg HR HPV;03-19-17 Neg:Neg HR HPV History of abnormal Pap:  Yes, Hx conization 04/2015--03-08-15 normal pap:Pos HR HPV w/#16 Pos genotype--colposcopy ECC at least high grade dysplasia;cannot exclude adenocarcinoma insitu MMG: N/a Colonoscopy:  n/a BMD:   n/a  Result  n/a TDaP:  2015 Gardasil:   no HIV: 09-29-16 NR Hep C:Not sure Screening Labs:   PCP.   reports that she has been smoking cigarettes. She has a 15.00 pack-year smoking history. She has never used smokeless tobacco. She reports previous alcohol use of about 6.0 standard drinks of alcohol per week. She reports that she does not use drugs.  Past Medical History:  Diagnosis Date  . Abnormal Pap smear of cervix 03-08-15   --normal pap:Pos HR HPV w/#16 Pos genotype--colposcopy ECC at least high grade dysplasia;cannot exclude adenocarcinoma insitu  . Anemia    d/t heavy menstrual cycles  . Anxiety   . Cancer ([place])    cervical pre cancerous cell  . Cervical scoliosis 02/04/2018  . Congenital nystagmus 06/18/2017  . Depression   . Dysmenorrhea   . GERD (gastroesophageal reflux disease) 06/23/2014  . Nystagmus    her whole life, sees [facility]  . Nystagmus   . Small fiber neuropathy     Past Surgical History:  Procedure Laterality Date  . ABDOMINAL HYSTERECTOMY  2018   R-TLH w/Bil.Salpingectomy  . CERVICAL CONIZATION W/BX N/A 04/27/2015   Procedure: CONIZATION CERVIX WITH BIOPSY with ECC;  Surgeon: Nunzio Cobbs, MD;  Location: [facility];  Service: Gynecology;  Laterality: N/A;  . CESAREAN SECTION     2009, 2006, and 2004  . CYSTOSCOPY N/A 06/22/2015   Procedure: CYSTOSCOPY;  Surgeon: Nunzio Cobbs, MD;  Location: [facility];  Service: Gynecology;  Laterality: N/A;  . WISDOM TOOTH EXTRACTION      Current Outpatient Medications  Medication Sig Dispense Refill  . ARIPiprazole (ABILIFY) 5 MG tablet Take by mouth.    . linaclotide (LINZESS) 145 MCG CAPS capsule Take by mouth.    . pregabalin (LYRICA) 75 MG capsule TAKE 1 CAPSULE BY MOUTH TWICE A DAY (Patient not taking: Reported on 03/24/2019) 60 capsule 3   No current facility-administered medications for this visit.     Family History  Problem Relation Age of Onset  . Colon cancer Father   . Cancer Sister 95       Dec from MVA--but had hysterectomy age 87 ?[place] (pt. states mom told her -- sister dx'd with germ induced ovarian ca)  . Hyperlipidemia Mother   . Diabetes Maternal Grandmother   . Diabetes Paternal Grandfather   . Stroke Maternal Grandfather     Review of Systems  All other systems reviewed and are negative.   Exam:   BP 112/72 (BP Location: Right Arm, Patient Position: Sitting, Cuff Size: Small)   Pulse 64   Temp (!) 97.3 F (36.3 C)   Ht 5'  6" (1.676 m)   Wt 177 lb 12.8 oz (80.6 kg)   LMP 06/06/2015 (Approximate)   BMI 28.70 kg/m     General appearance: alert, cooperative and appears stated age Head: normocephalic, without obvious abnormality, atraumatic Neck: no adenopathy, supple, symmetrical, trachea midline and thyroid normal to inspection and palpation Lungs: clear to auscultation bilaterally Breasts: normal appearance, no masses or tenderness, No nipple retraction or dimpling, No nipple discharge or bleeding, No axillary adenopathy Heart: regular rate and rhythm Abdomen: soft, non-tender; no masses, no organomegaly Extremities: extremities normal, atraumatic, no cyanosis or edema Skin: skin color,  texture, turgor normal. No rashes or lesions Lymph nodes: cervical, supraclavicular, and axillary nodes normal. Neurologic: grossly normal  Pelvic: External genitalia:  no lesions              No abnormal inguinal nodes palpated.              Urethra:  normal appearing urethra with no masses, tenderness or lesions              Bartholins and Skenes: normal                 Vagina: normal appearing vagina with normal color and discharge, no lesions              Cervix:  absent              Pap taken: Yes.   Bimanual Exam:  Uterus:  absent              Adnexa: no mass, fullness, tenderness                Chaperone was present for exam.  Assessment:   Well woman visit with normal exam. Status post robotic total laparoscopic hysterectomy with bilateral salpingectomy. Final pathology negative.  Preprocedure pathology - cervical adenocarcinoma in situ, CIN II and III. FH colon cancer age 72.   Plan: Mammogram screening discussed. Self breast awareness reviewed. Pap and HR HPV as above. Guidelines for Calcium, Vitamin D, regular exercise program including cardiovascular and weight bearing exercise. I suggested she establish care with a [facility] such [facility].  She may pursue this through her PCP.  Follow up annually and prn.   After visit summary provided.

## 2019-03-20 NOTE — Patient Instructions (Addendum)
General Preventive Care  Most recent routine screening lipids/other labs: ordered today   Everyone should have blood pressure checked once per year.   Tobacco: Please let me know if you need help quitting!  Alcohol: don't!  Exercise: as tolerated to reduce risk of cardiovascular disease and diabetes. Strength training will also prevent osteoporosis.   Mental health: if need for mental health care (medicines, counseling, other), or concerns about moods, please let me know!   Sexual health: if need for STD testing, or if concerns with libido/pain problems, please let me know! If you need to discuss your birth control options, please let me know!   Advanced Directive: Living Will and/or Healthcare Power of Attorney recommended for all adults, regardless of age or health.  Vaccines  Flu vaccine: recommended for almost everyone, every fall.   Shingles vaccine: Shingrix recommended after age 58.  Pneumonia vaccines: Prevnar and Pneumovax recommended after age 69, or sooner if certain medical conditions.  Tetanus booster: Tdap recommended every 10 years.  Cancer screenings   Colon cancer screening: recommended for everyone at age 51  Breast cancer screening: mammogram recommended at age 64 every other year at least, and annually after age 73.   Cervical cancer screening: Pap every 1 to 5 years depending on age and other risk factors. Can usually stop at age 85 or w/ hysterectomy.   Lung cancer screening: CT chest every year for those age 26 to 39 years old with ?30 pack year smoking history, who either currently smoke or have quit within the past 15 years. Infection screenings . HIV: recommended screening at least once age 47-65, more often as needed. . Gonorrhea/Chlamydia: screening as needed . Hepatitis C: recommended for anyone born 107-1966 . TB: certain at-risk populations, or depending on work requirements and/or travel history Other . Bone Density Test: recommended for women  at age 81, or after 48 for smokers.              Orthostatic Hypotension Blood pressure is a measurement of how strongly, or weakly, your blood is pressing against the walls of your arteries. Orthostatic hypotension is a sudden drop in blood pressure that happens when you quickly change positions, such as when you get up from sitting or lying down. Arteries are blood vessels that carry blood from your heart throughout your body. When blood pressure is too low, you may not get enough blood to your brain or to the rest of your organs. This can cause weakness, light-headedness, rapid heartbeat, and fainting. This can last for just a few seconds or for up to a few minutes. Orthostatic hypotension is usually not a serious problem. However, if it happens frequently or gets worse, it may be a sign of something more serious. What are the causes? This condition may be caused by:  Sudden changes in posture, such as standing up quickly after you have been sitting or lying down.  Blood loss.  Loss of body fluids (dehydration).  Heart problems.  Hormone (endocrine) problems.  Pregnancy.  Severe infection.  Lack of certain nutrients.  Severe allergic reactions (anaphylaxis).  Certain medicines, such as blood pressure medicine or medicines that make the body lose excess fluids (diuretics). Sometimes, this condition can be caused by not taking medicine as directed, such as taking too much of a certain medicine. What increases the risk? The following factors may make you more likely to develop this condition:  Age. Risk increases as you get older.  Conditions that affect the heart  or the central nervous system.  Taking certain medicines, such as blood pressure medicine or diuretics.  Being pregnant. What are the signs or symptoms? Symptoms of this condition may include:  Weakness.  Light-headedness.  Dizziness.  Blurred vision.  Fatigue.  Rapid heartbeat.  Fainting, in  severe cases. How is this diagnosed? This condition is diagnosed based on:  Your medical history.  Your symptoms.  Your blood pressure measurement. Your health care provider will check your blood pressure when you are: ? Lying down. ? Sitting. ? Standing. A blood pressure reading is recorded as two numbers, such as "120 over 80" (or 120/80). The first ("top") number is called the systolic pressure. It is a measure of the pressure in your arteries as your heart beats. The second ("bottom") number is called the diastolic pressure. It is a measure of the pressure in your arteries when your heart relaxes between beats. Blood pressure is measured in a unit called mm Hg. Healthy blood pressure for most adults is 120/80. If your blood pressure is below 90/60, you may be diagnosed with hypotension. Other information or tests that may be used to diagnose orthostatic hypotension include:  Your other vital signs, such as your heart rate and temperature.  Blood tests.  Tilt table test. For this test, you will be safely secured to a table that moves you from a lying position to an upright position. Your heart rhythm and blood pressure will be monitored during the test. How is this treated? This condition may be treated by:  Changing your diet. This may involve eating more salt (sodium) or drinking more water.  Taking medicines to raise your blood pressure.  Changing the dosage of certain medicines you are taking that might be lowering your blood pressure.  Wearing compression stockings. These stockings help to prevent blood clots and reduce swelling in your legs. In some cases, you may need to go to the hospital for:  Fluid replacement. This means you will receive fluids through an IV.  Blood replacement. This means you will receive donated blood through an IV (transfusion).  Treating an infection or heart problems, if this applies.  Monitoring. You may need to be monitored while medicines  that you are taking wear off. Follow these instructions at home: Eating and drinking   Drink enough fluid to keep your urine pale yellow.  Eat a healthy diet, and follow instructions from your health care provider about eating or drinking restrictions. A healthy diet includes: ? Fresh fruits and vegetables. ? Whole grains. ? Lean meats. ? Low-fat dairy products.  Eat extra salt only as directed. Do not add extra salt to your diet unless your health care provider told you to do that.  Eat frequent, small meals.  Avoid standing up suddenly after eating. Medicines  Take over-the-counter and prescription medicines only as told by your health care provider. ? Follow instructions from your health care provider about changing the dosage of your current medicines, if this applies. ? Do not stop or adjust any of your medicines on your own. General instructions   Wear compression stockings as told by your health care provider.  Get up slowly from lying down or sitting positions. This gives your blood pressure a chance to adjust.  Avoid hot showers and excessive heat as directed by your health care provider.  Return to your normal activities as told by your health care provider. Ask your health care provider what activities are safe for you.  Do not use  any products that contain nicotine or tobacco, such as cigarettes, e-cigarettes, and chewing tobacco. If you need help quitting, ask your health care provider.  Keep all follow-up visits as told by your health care provider. This is important. Contact a health care provider if you:  Vomit.  Have diarrhea.  Have a fever for more than 2-3 days.  Feel more thirsty than usual.  Feel weak and tired. Get help right away if you:  Have chest pain.  Have a fast or irregular heartbeat.  Develop numbness in any part of your body.  Cannot move your arms or your legs.  Have trouble speaking.  Become sweaty or feel  light-headed.  Faint.  Feel short of breath.  Have trouble staying awake.  Feel confused. Summary  Orthostatic hypotension is a sudden drop in blood pressure that happens when you quickly change positions.  Orthostatic hypotension is usually not a serious problem.  It is diagnosed by having your blood pressure taken lying down, sitting, and then standing.  It may be treated by changing your diet or adjusting your medicines. This information is not intended to replace advice given to you by your health care provider. Make sure you discuss any questions you have with your health care provider. Document Released: 07/28/2002 Document Revised: 01/31/2018 Document Reviewed: 01/31/2018 Elsevier Patient Education  2020 [employer].

## 2019-03-21 ENCOUNTER — Encounter: Payer: Self-pay | Admitting: Osteopathic Medicine

## 2019-03-21 DIAGNOSIS — M797 Fibromyalgia: Secondary | ICD-10-CM | POA: Diagnosis not present

## 2019-03-21 DIAGNOSIS — F17200 Nicotine dependence, unspecified, uncomplicated: Secondary | ICD-10-CM | POA: Diagnosis not present

## 2019-03-21 DIAGNOSIS — R42 Dizziness and giddiness: Secondary | ICD-10-CM | POA: Diagnosis not present

## 2019-03-21 DIAGNOSIS — Z0000 Encounter for general adult medical examination without abnormal findings: Secondary | ICD-10-CM | POA: Diagnosis not present

## 2019-03-22 LAB — COMPLETE METABOLIC PANEL WITH GFR
AG Ratio: 2.4 (calc) (ref 1.0–2.5)
ALT: 9 U/L (ref 6–29)
AST: 12 U/L (ref 10–30)
Albumin: 4.7 g/dL (ref 3.6–5.1)
Alkaline phosphatase (APISO): 43 U/L (ref 31–125)
BUN: 14 mg/dL (ref 7–25)
CO2: 23 mmol/L (ref 20–32)
Calcium: 9.5 mg/dL (ref 8.6–10.2)
Chloride: 106 mmol/L (ref 98–110)
Creat: 0.84 mg/dL (ref 0.50–1.10)
GFR, Est African American: 102 mL/min/{1.73_m2} (ref >=60)
GFR, Est Non African American: 88 mL/min/{1.73_m2} (ref >=60)
Globulin: 2 g/dL (calc) (ref 1.9–3.7)
Glucose, Bld: 90 mg/dL (ref 65–99)
Potassium: 4.5 mmol/L (ref 3.5–5.3)
Sodium: 138 mmol/L (ref 135–146)
Total Bilirubin: 1.2 mg/dL (ref 0.2–1.2)
Total Protein: 6.7 g/dL (ref 6.1–8.1)

## 2019-03-22 LAB — CBC
HCT: 45.8 % — ABNORMAL HIGH (ref 35.0–45.0)
Hemoglobin: 15.4 g/dL (ref 11.7–15.5)
MCH: 31.4 pg (ref 27.0–33.0)
MCHC: 33.6 g/dL (ref 32.0–36.0)
MCV: 93.3 fL (ref 80.0–100.0)
MPV: 9.8 fL (ref 7.5–12.5)
Platelets: 176 10*3/uL (ref 140–400)
RBC: 4.91 10*6/uL (ref 3.80–5.10)
RDW: 11.7 % (ref 11.0–15.0)
WBC: 5.1 10*3/uL (ref 3.8–10.8)

## 2019-03-22 LAB — URINALYSIS, ROUTINE W REFLEX MICROSCOPIC
Bilirubin Urine: NEGATIVE
Glucose, UA: NEGATIVE
Hgb urine dipstick: NEGATIVE
Ketones, ur: NEGATIVE
Leukocytes,Ua: NEGATIVE
Nitrite: NEGATIVE
Protein, ur: NEGATIVE
Specific Gravity, Urine: 1.005 (ref 1.001–1.03)
pH: 7 (ref 5.0–8.0)

## 2019-03-22 LAB — LIPID PANEL
Cholesterol: 155 mg/dL (ref <200)
HDL: 43 mg/dL — ABNORMAL LOW (ref >=50)
LDL Cholesterol (Calc): 98 mg/dL (calc)
Non-HDL Cholesterol (Calc): 112 mg/dL (calc) (ref <130)
Total CHOL/HDL Ratio: 3.6 (calc) (ref <5.0)
Triglycerides: 57 mg/dL (ref <150)

## 2019-03-22 LAB — TSH: TSH: 1.81 mIU/L

## 2019-03-24 ENCOUNTER — Encounter: Payer: Self-pay | Admitting: Osteopathic Medicine

## 2019-03-24 ENCOUNTER — Ambulatory Visit (INDEPENDENT_AMBULATORY_CARE_PROVIDER_SITE_OTHER): Payer: BC Managed Care – PPO | Admitting: Obstetrics and Gynecology

## 2019-03-24 ENCOUNTER — Other Ambulatory Visit (HOSPITAL_COMMUNITY)
Admission: RE | Admit: 2019-03-24 | Discharge: 2019-03-24 | Disposition: A | Discharge status: Home / Self Care | Payer: BC Managed Care – PPO | Source: Ambulatory Visit | Attending: Obstetrics and Gynecology | Admitting: Obstetrics and Gynecology

## 2019-03-24 ENCOUNTER — Other Ambulatory Visit: Payer: Self-pay

## 2019-03-24 ENCOUNTER — Encounter: Payer: Self-pay | Admitting: Obstetrics and Gynecology

## 2019-03-24 VITALS — BP 112/72 | HR 64 | Temp 97.3°F | Ht 66.0 in | Wt 177.8 lb

## 2019-03-24 DIAGNOSIS — D069 Carcinoma in situ of cervix, unspecified: Secondary | ICD-10-CM | POA: Diagnosis not present

## 2019-03-24 DIAGNOSIS — Z01419 Encounter for gynecological examination (general) (routine) without abnormal findings: Secondary | ICD-10-CM | POA: Insufficient documentation

## 2019-03-24 NOTE — Patient Instructions (Signed)

## 2019-03-25 LAB — CYTOLOGY - PAP
Diagnosis: NEGATIVE
HPV: NOT DETECTED

## 2019-03-30 ENCOUNTER — Encounter: Payer: Self-pay | Admitting: Osteopathic Medicine

## 2019-04-01 ENCOUNTER — Encounter: Payer: Self-pay | Admitting: Osteopathic Medicine

## 2019-04-01 ENCOUNTER — Telehealth (INDEPENDENT_AMBULATORY_CARE_PROVIDER_SITE_OTHER): Payer: BC Managed Care – PPO | Admitting: Osteopathic Medicine

## 2019-04-01 VITALS — Temp 98.5°F | Wt 173.0 lb

## 2019-04-01 DIAGNOSIS — Z91018 Allergy to other foods: Secondary | ICD-10-CM | POA: Diagnosis not present

## 2019-04-01 DIAGNOSIS — R42 Dizziness and giddiness: Secondary | ICD-10-CM

## 2019-04-01 MED ORDER — MECLIZINE HCL 25 MG PO TABS: 25.0000 mg | ORAL_TABLET | Freq: Three times a day (TID) | ORAL | 1 refills | Status: AC | PRN

## 2019-04-01 NOTE — Progress Notes (Signed)
Virtual Visit via Video (App used: MyChart) Note  I connected with      Tara Velazquez on 04/01/19 at 2:40 PM by a telemedicine application and verified that I am speaking with the correct person using two identifiers.  Patient is at home I am in office   I discussed the limitations of evaluation and management by telemedicine and the availability of in person appointments. The patient expressed understanding and agreed to proceed.    History of Present Illness: Tara Velazquez is a 38 y.o. female who would like to discuss dizziness, medications, lab results    Increased dizziness Abilify was helping but psychiatrist advised d/c this d/t dizziness Lyrica was also d/c d/t dizziness but she notes increased pain   Concerned about labs:  AG ratio high normal Bili higher TSH higher  Globulin lower Alk Phos low, concerned this "could be connected to my teeth issues" Concern for "maybe indicative of liver, kidney, autoimmune issues and I know that there is something unknown going on so I just keep investigating (I don't follow [employer]'s instructions):   Patient also requests referral to allergist for testing     Observations/Objective: Temp 98.5 F (36.9 C) (Oral)   Wt 173 lb (78.5 kg)   LMP 06/06/2015 (Approximate)   BMI 27.92 kg/m  BP Readings from Last 3 Encounters:  03/24/19 112/72  03/20/19 101/68  10/29/18 105/71   Exam: Normal Speech.  NAD  Lab and Radiology Results No results found for this or any previous visit (from the past 72 hour(s)). No results found.     Assessment and Plan: 38 y.o. female with The primary encounter diagnosis was Food allergy. A diagnosis of Dizziness was also pertinent to this visit.  Has gone to several neurologist in the past, has requested tilt table testing, has undergone several MRIs.  Has symptoms of orthostatic hypotension as well and was benign vertigo, complicated by longstanding nystagmus.  Patient does not recall ever  being on meclizine for symptom control, I think this might be reasonable to at least have on hand / try.    PDMP not reviewed this encounter. Orders Placed This Encounter  Procedures  . Ambulatory referral to Allergy    Referral Priority:   Routine    Referral Type:   Allergy Testing    Referral Reason:   Specialty Services Required    Requested Specialty:   Allergy    Number of Visits Requested:   1   Meds ordered this encounter  Medications  . meclizine (ANTIVERT) 25 MG tablet    Sig: Take 1 tablet (25 mg total) by mouth 3 (three) times daily as needed for dizziness.    Dispense:  30 tablet    Refill:  1       Follow Up Instructions: Return for Pending recommendations from neurology, sooner if worse/change.    I discussed the assessment and treatment plan with the patient. The patient was provided an opportunity to ask questions and all were answered. The patient agreed with the plan and demonstrated an understanding of the instructions.   The patient was advised to call back or seek an in-person evaluation if any new concerns, if symptoms worsen or if the condition fails to improve as anticipated.  25 minutes of non-face-to-face time was provided during this encounter.                      Historical information moved to improve visibility of documentation.  Past  Medical History:  Diagnosis Date  . Abnormal Pap smear of cervix 03-08-15   --normal pap:Pos HR HPV w/#16 Pos genotype--colposcopy ECC at least high grade dysplasia;cannot exclude adenocarcinoma insitu  . Anemia    d/t heavy menstrual cycles  . Anxiety   . Cancer ([place])    cervical pre cancerous cell  . Cervical scoliosis 02/04/2018  . Congenital nystagmus 06/18/2017  . Depression   . Dysmenorrhea   . GERD (gastroesophageal reflux disease) 06/23/2014  . Nystagmus    her whole life, sees [facility]  . Nystagmus   . Small fiber neuropathy    Past Surgical History:  Procedure  Laterality Date  . ABDOMINAL HYSTERECTOMY  2018   R-[facility] w/Bil.Salpingectomy  . CERVICAL CONIZATION W/BX N/A 04/27/2015   Procedure: CONIZATION CERVIX WITH BIOPSY with ECC;  Surgeon: Nunzio Cobbs, MD;  Location: [facility];  Service: Gynecology;  Laterality: N/A;  . CESAREAN SECTION     2009, 2006, and 2004  . CYSTOSCOPY N/A 06/22/2015   Procedure: CYSTOSCOPY;  Surgeon: Nunzio Cobbs, MD;  Location: [facility];  Service: Gynecology;  Laterality: N/A;  . WISDOM TOOTH EXTRACTION     Social History   Tobacco Use  . Smoking status: Current Every Day Smoker    Packs/day: 1.00    Years: 15.00    Pack years: 15.00    Types: Cigarettes  . Smokeless tobacco: Never Used  . Tobacco comment: at least 1 ppd  Substance Use Topics  . Alcohol use: Not Currently    Alcohol/week: 6.0 standard drinks    Types: 1 Glasses of wine, 1 Cans of beer, 1 Shots of liquor, 3 Standard drinks or equivalent per week    Comment: quit 03/2018   family history includes Cancer (age of onset: 12) in her sister; Colon cancer in her father; Diabetes in her maternal grandmother and paternal grandfather; Hyperlipidemia in her mother; Stroke in her maternal grandfather.  Medications: Current Outpatient Medications  Medication Sig Dispense Refill  . linaclotide (LINZESS) 145 MCG CAPS capsule Take by mouth.    . ARIPiprazole (ABILIFY) 5 MG tablet Take by mouth.    . meclizine (ANTIVERT) 25 MG tablet Take 1 tablet (25 mg total) by mouth 3 (three) times daily as needed for dizziness. 30 tablet 1  . pregabalin (LYRICA) 75 MG capsule TAKE 1 CAPSULE BY MOUTH TWICE A DAY (Patient not taking: Reported on 03/24/2019) 60 capsule 3   No current facility-administered medications for this visit.    Allergies  Allergen Reactions  . Coconut Oil Anaphylaxis  . Amoxicillin Hives    Not sure if ever had penicillin  . Coconut Flavor Swelling    PDMP not reviewed this encounter. Orders Placed This Encounter  Procedures   . Ambulatory referral to Allergy    Referral Priority:   Routine    Referral Type:   Allergy Testing    Referral Reason:   Specialty Services Required    Requested Specialty:   Allergy    Number of Visits Requested:   1   Meds ordered this encounter  Medications  . meclizine (ANTIVERT) 25 MG tablet    Sig: Take 1 tablet (25 mg total) by mouth 3 (three) times daily as needed for dizziness.    Dispense:  30 tablet    Refill:  1

## 2019-04-02 DIAGNOSIS — R0683 Snoring: Secondary | ICD-10-CM | POA: Diagnosis not present

## 2019-04-02 DIAGNOSIS — G4763 Sleep related bruxism: Secondary | ICD-10-CM | POA: Diagnosis not present

## 2019-04-02 DIAGNOSIS — G4719 Other hypersomnia: Secondary | ICD-10-CM | POA: Diagnosis not present

## 2019-04-03 ENCOUNTER — Ambulatory Visit (INDEPENDENT_AMBULATORY_CARE_PROVIDER_SITE_OTHER): Payer: BC Managed Care – PPO | Admitting: Allergy and Immunology

## 2019-04-03 ENCOUNTER — Encounter: Payer: Self-pay | Admitting: Allergy and Immunology

## 2019-04-03 ENCOUNTER — Other Ambulatory Visit: Payer: Self-pay

## 2019-04-03 VITALS — BP 106/66 | HR 88 | Temp 98.6°F | Resp 16 | Ht 65.9 in | Wt 174.8 lb

## 2019-04-03 DIAGNOSIS — Z91018 Allergy to other foods: Secondary | ICD-10-CM

## 2019-04-03 DIAGNOSIS — L500 Allergic urticaria: Secondary | ICD-10-CM | POA: Diagnosis not present

## 2019-04-03 DIAGNOSIS — T7840XD Allergy, unspecified, subsequent encounter: Secondary | ICD-10-CM

## 2019-04-03 DIAGNOSIS — T7840XA Allergy, unspecified, initial encounter: Secondary | ICD-10-CM | POA: Insufficient documentation

## 2019-04-03 MED ORDER — TRIAMCINOLONE ACETONIDE 0.1 % EX CREA: TOPICAL_CREAM | CUTANEOUS | 0 refills | Status: AC

## 2019-04-03 MED ORDER — EPINEPHRINE 0.3 MG/0.3ML IJ SOAJ: 0.3000 mg | Freq: Once | INTRAMUSCULAR | 2 refills | Status: AC

## 2019-04-03 NOTE — Assessment & Plan Note (Signed)
Unclear etiology. This does not appear to be urticaria, contact dermatitis, or atopic dermatitis. Food allergen skin tests were negative today despite a positive histamine control.  A prescription has been provided for triamcinolone 0.1% cream sparingly to affected areas twice daily as needed.  Should significant symptoms recur or new symptoms occur, a journal is to be kept recording any foods eaten, beverages consumed, medications taken, activities performed, and environmental conditions within a 6 hour time period prior to the onset of symptoms. For any symptoms concerning for anaphylaxis, epinephrine is to be administered and 911 is to be called immediately.   If symptoms persist or progress, dermatology evaluation with biopsy of an active lesion is recommended.

## 2019-04-03 NOTE — Progress Notes (Signed)
New Patient Note  RE: Tara Velazquez MRN: [id number] DOB: May 30, 1981 Date of Office Visit: 04/03/2019  Referring provider: Emeterio Reeve, DO Primary care provider: Emeterio Reeve, DO  Chief Complaint: Allergic Reaction, Food Intolerance, and Rash  History of present illness: Tara Velazquez is a 38 y.o. female seen today in consultation requested by Emeterio Reeve, DO.  She reports that over the past 2 years she has experienced recurrent episodes of a rash which typically involves the right anterior part of her neck, but on occasion will also include the left anterior neck.  The rash is described as "tiny, red bumps".  Individual lesions are described as painful/sore and nonpruritic.  They are typically 5-7 lesions at a time and individual lesions persist for approximately 1 week prior to resolving without residual pigmentation or bruising.  She initially thought that it was acne, however the lesions are nonpustular, nonvesicular, without exudate.  No specific medication, food, skin care product, detergent, soap, or other environmental triggers have been identified.  She also complains of the sensation of "throat constriction".  This sensation occurs approximately once a week and lasts for 1 to 2 days.  This symptom does not seem to be associated with the rash on her neck.  She initially thought that the sensation of throat constriction was due to the consumption of nuts, but she is uncertain.  She has not attempted to strictly eliminate nuts from her diet.  She reports that when she was a child her mouth would swell with the consumption of coconut and she notes that she still experiences symptoms with coconut from time to time, however not consistently. She also complains of "stomach pain every day for the past year and a half."  She had also been experiencing "gas related chest pain" which was relieved with an unspecified medication.  She experiences bloating and flatulation.  She has  evaluated, and is currently followed by, a gastroenterologist.  She has been given the diagnosis of IBS.  She is unable to identify any specific food that contributes to these symptoms, however is interested in food allergy skin testing today for the rash, the sensation of throat constriction, and abdominal symptoms.  She reports that she does have a history of allergic rhinitis but is uninterested in environmental allergy skin testing today.  Assessment and plan: Sensation of throat constriction Food allergen skin test today were negative despite a positive histamine control.  The negative predictive value for food allergen skin testing is excellent (approximately 95%).  It is possible that the sensation of throat constriction is secondary to acid reflux.  However, we will check labs to confirm skin tests to nuts as well as rule out other potential etiologies.  The following labs have been ordered: baseline serum tryptase, C4, CMP, ESR, ANA, and serum specific IgE against nut panel and alpha gal panel.  CBC was checked 2 weeks ago.  Should symptoms recur, a journal is to be kept recording any foods eaten, beverages consumed, medications taken within a 6 hour period prior to the onset of symptoms, as well as activities performed, and environmental conditions. For any symptoms concerning for anaphylaxis, epinephrine is to be administered and 911 is to be called immediately.  A prescription has been provided for epinephrine autoinjector 2 pack (Auvi-Q) along with instructions for its proper administration.  GI symptoms Skin tests to select food allergens were negative today. While this does not appear to be an IgE mediated issue, skin testing does not rule out food  intolerances or cell-mediated enteropathies which may lend to GI symptoms. These etiologies are suggested when elimination of the responsible food leads to symptom resolution and re-introduction of the food is followed by the return of  symptoms.   The patient has been encouraged to keep a careful symptom/food journal and eliminate any food suspected of correlating with symptoms. Should symptoms concerning for anaphylaxis arise, 911 is to be called immediately.  If GI symptoms persist or progress, further gastroenterologist evaluation may be warranted.  Recurrent dermatitis Unclear etiology. This does not appear to be urticaria, contact dermatitis, or atopic dermatitis. Food allergen skin tests were negative today despite a positive histamine control.  A prescription has been provided for triamcinolone 0.1% cream sparingly to affected areas twice daily as needed.  Should significant symptoms recur or new symptoms occur, a journal is to be kept recording any foods eaten, beverages consumed, medications taken, activities performed, and environmental conditions within a 6 hour time period prior to the onset of symptoms. For any symptoms concerning for anaphylaxis, epinephrine is to be administered and 911 is to be called immediately.   If symptoms persist or progress, dermatology evaluation with biopsy of an active lesion is recommended.   Meds ordered this encounter  Medications  . EPINEPHrine (AUVI-Q) 0.3 mg/0.3 mL IJ SOAJ injection    Sig: Inject 0.3 mLs (0.3 mg total) into the muscle once for 1 dose. As directed for life-threatening allergic reactions    Dispense:  2 each    Refill:  2  . triamcinolone cream (KENALOG) 0.1 %    Sig: Apply sparingly to affected areas twice daily as needed    Dispense:  30 g    Refill:  0    Diagnostics: Food allergen skin testing: Negative despite a positive histamine control.    Physical examination: Blood pressure 106/66, pulse 88, temperature 98.6 F (37 C), temperature source Temporal, resp. rate 16, height 5' 5.9" (1.674 m), weight 174 lb 12.8 oz (79.3 kg), last menstrual period 06/06/2015, SpO2 97 %.  General: Alert, interactive, in no acute distress. HEENT: TMs pearly  gray, turbinates mildly edematous without discharge, post-pharynx unremarkable. Neck: Supple without lymphadenopathy. Lungs: Clear to auscultation without wheezing, rhonchi or rales. CV: Normal S1, S2 without murmurs. Abdomen: Nondistended, nontender. Skin: Warm and dry, without lesions or rashes. Extremities:  No clubbing, cyanosis or edema. Neuro:   Grossly intact.  Review of systems:  Review of systems negative except as noted in HPI / PMHx or noted below: Review of Systems  Constitutional: Negative.   HENT: Negative.   Eyes: Negative.   Respiratory: Negative.   Cardiovascular: Negative.   Gastrointestinal: Negative.   Genitourinary: Negative.   Musculoskeletal: Negative.   Skin: Negative.   Neurological: Negative.   Endo/Heme/Allergies: Negative.   Psychiatric/Behavioral: Negative.     Past medical history:  Past Medical History:  Diagnosis Date  . Abnormal Pap smear of cervix 03-08-15   --normal pap:Pos HR HPV w/#16 Pos genotype--colposcopy ECC at least high grade dysplasia;cannot exclude adenocarcinoma insitu  . Anemia    d/t heavy menstrual cycles  . Anxiety   . Cancer ([place])    cervical pre cancerous cell  . Cervical scoliosis 02/04/2018  . Congenital nystagmus 06/18/2017  . Depression   . Dysmenorrhea   . Eczema   . GERD (gastroesophageal reflux disease) 06/23/2014  . Nystagmus    her whole life, sees [place]  . Nystagmus   . Small fiber neuropathy   . Urticaria  Past surgical history:  Past Surgical History:  Procedure Laterality Date  . ABDOMINAL HYSTERECTOMY  2018   R-TLH w/Bil.Salpingectomy  . CERVICAL CONIZATION W/BX N/A 04/27/2015   Procedure: CONIZATION CERVIX WITH BIOPSY with ECC;  Surgeon: Nunzio Cobbs, MD;  Location: [facility];  Service: Gynecology;  Laterality: N/A;  . CESAREAN SECTION     2009, 2006, and 2004  . CYSTOSCOPY N/A 06/22/2015   Procedure: CYSTOSCOPY;  Surgeon: Nunzio Cobbs, MD;  Location: [facility];   Service: Gynecology;  Laterality: N/A;  . WISDOM TOOTH EXTRACTION      Family history: Family History  Problem Relation Age of Onset  . Colon cancer Father   . Cancer Sister 41       Dec from MVA--but had hysterectomy age 26 ?CA (pt. states mom told her -- sister dx'd with germ induced ovarian ca)  . Hyperlipidemia Mother   . Diabetes Maternal Grandmother   . Diabetes Paternal Grandfather   . Stroke Maternal Grandfather   . Allergic rhinitis Neg Hx   . Asthma Neg Hx   . Eczema Neg Hx   . Urticaria Neg Hx     Social history: Social History   Socioeconomic History  . Marital status: Married    Spouse name: Not on file  . Number of children: 3  . Years of education: Not on file  . Highest education level: Associate degree: academic program  Occupational History    Comment: NA  Social Needs  . Financial resource strain: Not on file  . Food insecurity    Worry: Not on file    Inability: Not on file  . Transportation needs    Medical: Not on file    Non-medical: Not on file  Tobacco Use  . Smoking status: Current Every Day Smoker    Packs/day: 1.00    Years: 15.00    Pack years: 15.00    Types: Cigarettes  . Smokeless tobacco: Never Used  . Tobacco comment: at least 1 ppd  Substance and Sexual Activity  . Alcohol use: Not Currently    Alcohol/week: 6.0 standard drinks    Types: 1 Glasses of wine, 1 Cans of beer, 1 Shots of liquor, 3 Standard drinks or equivalent per week    Comment: quit 03/2018  . Drug use: No  . Sexual activity: Yes    Partners: Male    Birth control/protection: Surgical    Comment: Tubal/Hysterectomy  Lifestyle  . Physical activity    Days per week: Not on file    Minutes per session: Not on file  . Stress: Not on file  Relationships  . Social Herbalist on phone: Not on file    Gets together: Not on file    Attends religious service: Not on file    Active member of club or organization: Not on file    Attends meetings of  clubs or organizations: Not on file    Relationship status: Not on file  . Intimate partner violence    Fear of current or ex partner: Not on file    Emotionally abused: Not on file    Physically abused: Not on file    Forced sexual activity: Not on file  Other Topics Concern  . Not on file  Social History Narrative   Work or School: homemaker      Home Situation: lives with husband and 3 children      Spiritual Beliefs: none  Lifestyle: walks/jogs 2 times per week/ diet is poor   Caffeine- coffee 6 cups daily   Environmental History: The patient lives in a 39 year old house with carpeting the bedroom, gas heat, and central air.  It is not known if there is mold/water damage in the home.  There are dogs in the home which have access to her bedroom.  She is a cigarette smoker with a 14-pack-year history.  Allergies as of 04/03/2019      Reactions   Coconut Oil Anaphylaxis   Amoxicillin Hives   Not sure if ever had penicillin   Coconut Flavor Swelling      Medication List       Accurate as of April 03, 2019 11:01 AM. If you have any questions, ask your nurse or doctor.        STOP taking these medications   ARIPiprazole 5 MG tablet Commonly known as: ABILIFY Stopped by: Edmonia Lynch, MD     TAKE these medications   EPINEPHrine 0.3 mg/0.3 mL Soaj injection Commonly known as: Auvi-Q Inject 0.3 mLs (0.3 mg total) into the muscle once for 1 dose. As directed for life-threatening allergic reactions Started by: Edmonia Lynch, MD   linaclotide 145 MCG Caps capsule Commonly known as: LINZESS Take by mouth.   meclizine 25 MG tablet Commonly known as: ANTIVERT Take 1 tablet (25 mg total) by mouth 3 (three) times daily as needed for dizziness.   pregabalin 75 MG capsule Commonly known as: LYRICA TAKE 1 CAPSULE BY MOUTH TWICE A DAY   triamcinolone cream 0.1 % Commonly known as: KENALOG Apply sparingly to affected areas twice daily as needed Started by: Edmonia Lynch, MD       Known medication allergies: Allergies  Allergen Reactions  . Coconut Oil Anaphylaxis  . Amoxicillin Hives    Not sure if ever had penicillin  . Coconut Flavor Swelling    I appreciate the opportunity to take part in Tara Velazquez's care. Please do not hesitate to contact me with questions.  Sincerely,   R. Edgar Frisk, MD

## 2019-04-03 NOTE — Assessment & Plan Note (Signed)
Skin tests to select food allergens were negative today. While this does not appear to be an IgE mediated issue, skin testing does not rule out food intolerances or cell-mediated enteropathies which may lend to GI symptoms. These etiologies are suggested when elimination of the responsible food leads to symptom resolution and re-introduction of the food is followed by the return of symptoms.   The patient has been encouraged to keep a careful symptom/food journal and eliminate any food suspected of correlating with symptoms. Should symptoms concerning for anaphylaxis arise, 911 is to be called immediately.  If GI symptoms persist or progress, further gastroenterologist evaluation may be warranted.

## 2019-04-03 NOTE — Patient Instructions (Addendum)
Sensation of throat constriction Food allergen skin test today were negative despite a positive histamine control.  The negative predictive value for food allergen skin testing is excellent (approximately 95%).  It is possible that the sensation of throat constriction is secondary to acid reflux.  However, we will check labs to confirm skin tests to nuts as well as rule out other potential etiologies.  The following labs have been ordered: baseline serum tryptase, C4, CMP, ESR, ANA, and serum specific IgE against nut panel and alpha gal panel.  CBC was checked 2 weeks ago.  Should symptoms recur, a journal is to be kept recording any foods eaten, beverages consumed, medications taken within a 6 hour period prior to the onset of symptoms, as well as activities performed, and environmental conditions. For any symptoms concerning for anaphylaxis, epinephrine is to be administered and 911 is to be called immediately.  A prescription has been provided for epinephrine autoinjector 2 pack (Auvi-Q) along with instructions for its proper administration.  GI symptoms Skin tests to select food allergens were negative today. While this does not appear to be an IgE mediated issue, skin testing does not rule out food intolerances or cell-mediated enteropathies which may lend to GI symptoms. These etiologies are suggested when elimination of the responsible food leads to symptom resolution and re-introduction of the food is followed by the return of symptoms.   The patient has been encouraged to keep a careful symptom/food journal and eliminate any food suspected of correlating with symptoms. Should symptoms concerning for anaphylaxis arise, 911 is to be called immediately.  If GI symptoms persist or progress, further gastroenterologist evaluation may be warranted.  Recurrent dermatitis Unclear etiology. This does not appear to be urticaria, contact dermatitis, or atopic dermatitis. Food allergen skin tests were  negative today despite a positive histamine control.  A prescription has been provided for triamcinolone 0.1% cream sparingly to affected areas twice daily as needed.  Should significant symptoms recur or new symptoms occur, a journal is to be kept recording any foods eaten, beverages consumed, medications taken, activities performed, and environmental conditions within a 6 hour time period prior to the onset of symptoms. For any symptoms concerning for anaphylaxis, epinephrine is to be administered and 911 is to be called immediately.   If symptoms persist or progress, dermatology evaluation with biopsy of an active lesion is recommended.   When lab results have returned the patient will be called with further recommendations.

## 2019-04-03 NOTE — Assessment & Plan Note (Addendum)
Food allergen skin test today were negative despite a positive histamine control.  The negative predictive value for food allergen skin testing is excellent (approximately 95%).  It is possible that the sensation of throat constriction is secondary to acid reflux.  However, we will check labs to confirm skin tests to nuts as well as rule out other potential etiologies.  The following labs have been ordered: baseline serum tryptase, C4, CMP, ESR, ANA, and serum specific IgE against nut panel and alpha gal panel.  CBC was checked 2 weeks ago.  Should symptoms recur, a journal is to be kept recording any foods eaten, beverages consumed, medications taken within a 6 hour period prior to the onset of symptoms, as well as activities performed, and environmental conditions. For any symptoms concerning for anaphylaxis, epinephrine is to be administered and 911 is to be called immediately.  A prescription has been provided for epinephrine autoinjector 2 pack (Auvi-Q) along with instructions for its proper administration.

## 2019-04-08 ENCOUNTER — Encounter: Payer: Self-pay | Admitting: Osteopathic Medicine

## 2019-04-09 LAB — ALPHA-GAL PANEL
Alpha Gal IgE*: <0.1 kU/L (ref <0.10)
Beef (Bos spp) IgE: <0.1 kU/L (ref <0.35)
Class Interpretation: 0
Class Interpretation: 0
Class Interpretation: 0
Lamb/Mutton (Ovis spp) IgE: <0.1 kU/L (ref <0.35)
Pork (Sus spp) IgE: <0.1 kU/L (ref <0.35)

## 2019-04-09 LAB — COMPREHENSIVE METABOLIC PANEL
ALT: 11 IU/L (ref 0–32)
AST: 12 IU/L (ref 0–40)
Albumin/Globulin Ratio: 2.4 — ABNORMAL HIGH (ref 1.2–2.2)
Albumin: 4.7 g/dL (ref 3.8–4.8)
Alkaline Phosphatase: 57 IU/L (ref 39–117)
BUN/Creatinine Ratio: 11 (ref 9–23)
BUN: 9 mg/dL (ref 6–20)
Bilirubin Total: 0.6 mg/dL (ref 0.0–1.2)
CO2: 24 mmol/L (ref 20–29)
Calcium: 9.5 mg/dL (ref 8.7–10.2)
Chloride: 104 mmol/L (ref 96–106)
Creatinine, Ser: 0.83 mg/dL (ref 0.57–1.00)
GFR calc Af Amer: 103 mL/min/{1.73_m2} (ref >59)
GFR calc non Af Amer: 90 mL/min/{1.73_m2} (ref >59)
Globulin, Total: 2 g/dL (ref 1.5–4.5)
Glucose: 93 mg/dL (ref 65–99)
Potassium: 4.7 mmol/L (ref 3.5–5.2)
Sodium: 141 mmol/L (ref 134–144)
Total Protein: 6.7 g/dL (ref 6.0–8.5)

## 2019-04-09 LAB — ALLERGENS(7)
Brazil Nut IgE: <0.1 kU/L
F020-IgE Almond: <0.1 kU/L
F202-IgE Cashew Nut: <0.1 kU/L
Hazelnut (Filbert) IgE: <0.1 kU/L
Peanut IgE: <0.1 kU/L
Pecan Nut IgE: <0.1 kU/L
Walnut IgE: <0.1 kU/L

## 2019-04-09 LAB — ANA: ANA Titer 1: NEGATIVE

## 2019-04-09 LAB — C4 COMPLEMENT: Complement C4, Serum: 18 mg/dL (ref 14–44)

## 2019-04-09 LAB — TRYPTASE: Tryptase: 7.1 ug/L (ref 2.2–13.2)

## 2019-04-09 LAB — SEDIMENTATION RATE: Sed Rate: 2 mm/hr (ref 0–32)

## 2019-04-16 DIAGNOSIS — K589 Irritable bowel syndrome without diarrhea: Secondary | ICD-10-CM | POA: Diagnosis not present

## 2019-04-16 DIAGNOSIS — R42 Dizziness and giddiness: Secondary | ICD-10-CM | POA: Diagnosis not present

## 2019-04-16 DIAGNOSIS — H5581 Saccadic eye movements: Secondary | ICD-10-CM | POA: Diagnosis not present

## 2019-04-16 DIAGNOSIS — H5334 Suppression of binocular vision: Secondary | ICD-10-CM | POA: Diagnosis not present

## 2019-04-16 DIAGNOSIS — G629 Polyneuropathy, unspecified: Secondary | ICD-10-CM | POA: Diagnosis not present

## 2019-04-16 DIAGNOSIS — G4700 Insomnia, unspecified: Secondary | ICD-10-CM | POA: Diagnosis not present

## 2019-04-16 DIAGNOSIS — R5383 Other fatigue: Secondary | ICD-10-CM | POA: Diagnosis not present

## 2019-04-17 DIAGNOSIS — F458 Other somatoform disorders: Secondary | ICD-10-CM | POA: Diagnosis not present

## 2019-04-17 DIAGNOSIS — R0683 Snoring: Secondary | ICD-10-CM | POA: Diagnosis not present

## 2019-04-17 DIAGNOSIS — R42 Dizziness and giddiness: Secondary | ICD-10-CM | POA: Diagnosis not present

## 2019-04-24 DIAGNOSIS — R42 Dizziness and giddiness: Secondary | ICD-10-CM | POA: Diagnosis not present

## 2019-04-29 ENCOUNTER — Encounter: Payer: Self-pay | Admitting: Osteopathic Medicine

## 2019-04-30 DIAGNOSIS — F411 Generalized anxiety disorder: Secondary | ICD-10-CM | POA: Diagnosis not present

## 2019-04-30 DIAGNOSIS — F3181 Bipolar II disorder: Secondary | ICD-10-CM | POA: Diagnosis not present

## 2019-05-07 DIAGNOSIS — G629 Polyneuropathy, unspecified: Secondary | ICD-10-CM | POA: Diagnosis not present

## 2019-05-07 DIAGNOSIS — G4700 Insomnia, unspecified: Secondary | ICD-10-CM | POA: Diagnosis not present

## 2019-05-07 DIAGNOSIS — K589 Irritable bowel syndrome without diarrhea: Secondary | ICD-10-CM | POA: Diagnosis not present

## 2019-05-07 DIAGNOSIS — R5383 Other fatigue: Secondary | ICD-10-CM | POA: Diagnosis not present

## 2019-06-05 DIAGNOSIS — G629 Polyneuropathy, unspecified: Secondary | ICD-10-CM | POA: Diagnosis not present

## 2019-06-11 DIAGNOSIS — G629 Polyneuropathy, unspecified: Secondary | ICD-10-CM | POA: Diagnosis not present

## 2019-06-20 DIAGNOSIS — R1012 Left upper quadrant pain: Secondary | ICD-10-CM | POA: Diagnosis not present

## 2019-06-20 DIAGNOSIS — R161 Splenomegaly, not elsewhere classified: Secondary | ICD-10-CM | POA: Diagnosis not present

## 2019-06-25 DIAGNOSIS — K5900 Constipation, unspecified: Secondary | ICD-10-CM | POA: Diagnosis not present

## 2019-06-25 DIAGNOSIS — R1012 Left upper quadrant pain: Secondary | ICD-10-CM | POA: Diagnosis not present

## 2019-07-23 DIAGNOSIS — F411 Generalized anxiety disorder: Secondary | ICD-10-CM | POA: Diagnosis not present

## 2019-07-23 DIAGNOSIS — F3181 Bipolar II disorder: Secondary | ICD-10-CM | POA: Diagnosis not present

## 2019-08-12 DIAGNOSIS — R21 Rash and other nonspecific skin eruption: Secondary | ICD-10-CM | POA: Diagnosis not present

## 2019-08-12 DIAGNOSIS — G5602 Carpal tunnel syndrome, left upper limb: Secondary | ICD-10-CM | POA: Diagnosis not present

## 2019-08-12 DIAGNOSIS — Z79899 Other long term (current) drug therapy: Secondary | ICD-10-CM | POA: Diagnosis not present

## 2019-08-12 DIAGNOSIS — G629 Polyneuropathy, unspecified: Secondary | ICD-10-CM | POA: Diagnosis not present

## 2019-08-17 ENCOUNTER — Telehealth: Payer: BC Managed Care – PPO | Admitting: Physician Assistant

## 2019-08-17 DIAGNOSIS — K1370 Unspecified lesions of oral mucosa: Secondary | ICD-10-CM

## 2019-08-17 DIAGNOSIS — J029 Acute pharyngitis, unspecified: Secondary | ICD-10-CM

## 2019-08-17 NOTE — Progress Notes (Signed)
Based on what you shared with me, I feel your condition warrants further evaluation and I recommend that you be seen for a face to face visit.    Without being able to see the roof of the mouth jt is hard to tell if there [place] any thrush present causing the sloughing in the mouth, if it is aphthous ulceration or a potential stomatitis. These are treated differently.  Please contact your primary care physician practice to be seen. Many offices offer virtual options to be seen via video if you are not comfortable going in person to a medical facility at this time.  If you do not have a PCP, [facility] offers a free physician referral service available at [phone]. Our trained staff has the experience, knowledge and resources to put you in touch with a physician who is right for you.   You also have the option of a video visit through [email or web address]  If you are having a true medical emergency please call 911.  NOTE: If you entered your credit card information for this eVisit, you will not be charged. You may see a "hold" on your card for the $35 but that hold will drop off and you will not have a charge processed.  Your e-visit answers were reviewed by a board certified advanced clinical practitioner to complete your personal care plan.  Thank you for using e-Visits.

## 2019-08-18 ENCOUNTER — Encounter: Payer: Self-pay | Admitting: Family Medicine

## 2019-08-18 ENCOUNTER — Telehealth (INDEPENDENT_AMBULATORY_CARE_PROVIDER_SITE_OTHER): Payer: BC Managed Care – PPO | Admitting: Family Medicine

## 2019-08-18 DIAGNOSIS — J029 Acute pharyngitis, unspecified: Secondary | ICD-10-CM | POA: Diagnosis not present

## 2019-08-18 DIAGNOSIS — K1379 Other lesions of oral mucosa: Secondary | ICD-10-CM | POA: Diagnosis not present

## 2019-08-18 MED ORDER — MAGIC MOUTHWASH: ORAL | 0 refills | Status: DC

## 2019-08-18 NOTE — Progress Notes (Signed)
Virtual Visit via Video Note  I connected with Tara Velazquez on 08/18/19 at  3:20 PM EST by a video enabled telemedicine application and verified that I am speaking with the correct person using two identifiers.   I discussed the limitations of evaluation and management by telemedicine and the availability of in person appointments. The patient expressed understanding and agreed to proceed.  Subjective:    CC: Sore throat  HPI: 38 yo complains of a sore area on the roof of her mouth.  It hurts and feels swollen.  She also complains of a sore throat  As well.  She says it is painful to swallow and even just breathing and air is pretty uncomfortable.  She says the lesion on the roof of her mouth peeled and now it looks like it has a crack in it.  She does not remember burning the roof of the mouth or any injury.  She is never had problems with oral ulcers before.  She does not think she has had any swollen lymph nodes but she is not sure.  She is never had problems with thrush.  She does not use inhalers.  No fever, chills, or sweats.   Past medical history, Surgical history, Family history not pertinant except as noted below, Social history, Allergies, and medications have been entered into the medical record, reviewed, and corrections made.   Review of Systems: No fevers, chills, night sweats, weight loss, chest pain, or shortness of breath.   Objective:    General: Speaking clearly in complete sentences without any shortness of breath.  Alert and oriented x3.  Normal judgment. No apparent acute distress.    Impression and Recommendations:   Sore on the roof of the mouth-most consistent with an ulcer.  She also has a concomitant sore throat.  We will send over scription for Magic mouthwash that also contains some nystatin to cover for possible thrush.  If she is not feeling better at the end of the week then she will need to come in for an appointment for exam.    I discussed the  assessment and treatment plan with the patient. The patient was provided an opportunity to ask questions and all were answered. The patient agreed with the plan and demonstrated an understanding of the instructions.   The patient was advised to call back or seek an in-person evaluation if the symptoms worsen or if the condition fails to improve as anticipated.   Beatrice Lecher, MD

## 2019-08-19 ENCOUNTER — Telehealth: Payer: BC Managed Care – PPO | Admitting: Family Medicine

## 2019-09-04 ENCOUNTER — Encounter: Payer: Self-pay | Admitting: Osteopathic Medicine

## 2019-09-10 DIAGNOSIS — R1310 Dysphagia, unspecified: Secondary | ICD-10-CM | POA: Diagnosis not present

## 2019-09-10 DIAGNOSIS — K219 Gastro-esophageal reflux disease without esophagitis: Secondary | ICD-10-CM | POA: Diagnosis not present

## 2019-09-10 DIAGNOSIS — K581 Irritable bowel syndrome with constipation: Secondary | ICD-10-CM | POA: Diagnosis not present

## 2019-09-10 DIAGNOSIS — R110 Nausea: Secondary | ICD-10-CM | POA: Diagnosis not present

## 2019-09-18 DIAGNOSIS — K219 Gastro-esophageal reflux disease without esophagitis: Secondary | ICD-10-CM | POA: Diagnosis not present

## 2019-09-18 DIAGNOSIS — K228 Other specified diseases of esophagus: Secondary | ICD-10-CM | POA: Diagnosis not present

## 2019-09-18 DIAGNOSIS — K3189 Other diseases of stomach and duodenum: Secondary | ICD-10-CM | POA: Diagnosis not present

## 2019-09-18 DIAGNOSIS — K2950 Unspecified chronic gastritis without bleeding: Secondary | ICD-10-CM | POA: Diagnosis not present

## 2019-10-16 DIAGNOSIS — L600 Ingrowing nail: Secondary | ICD-10-CM | POA: Diagnosis not present

## 2019-10-16 DIAGNOSIS — S91209A Unspecified open wound of unspecified toe(s) with damage to nail, initial encounter: Secondary | ICD-10-CM | POA: Diagnosis not present

## 2019-10-16 DIAGNOSIS — M21611 Bunion of right foot: Secondary | ICD-10-CM | POA: Diagnosis not present

## 2019-10-16 DIAGNOSIS — M21612 Bunion of left foot: Secondary | ICD-10-CM | POA: Diagnosis not present

## 2019-10-28 DIAGNOSIS — F411 Generalized anxiety disorder: Secondary | ICD-10-CM | POA: Diagnosis not present

## 2019-10-28 DIAGNOSIS — F3181 Bipolar II disorder: Secondary | ICD-10-CM | POA: Diagnosis not present

## 2019-10-28 DIAGNOSIS — F4521 Hypochondriasis: Secondary | ICD-10-CM | POA: Diagnosis not present

## 2019-10-29 ENCOUNTER — Ambulatory Visit (INDEPENDENT_AMBULATORY_CARE_PROVIDER_SITE_OTHER): Payer: BC Managed Care – PPO | Admitting: Sports Medicine

## 2019-10-29 ENCOUNTER — Ambulatory Visit: Payer: BC Managed Care – PPO | Admitting: Sports Medicine

## 2019-10-29 ENCOUNTER — Encounter: Payer: Self-pay | Admitting: Sports Medicine

## 2019-10-29 DIAGNOSIS — M47812 Spondylosis without myelopathy or radiculopathy, cervical region: Secondary | ICD-10-CM | POA: Diagnosis not present

## 2019-10-29 MED ORDER — PREDNISONE 50 MG PO TABS: ORAL_TABLET | ORAL | 0 refills | Status: AC

## 2019-10-29 MED ORDER — MELOXICAM 15 MG PO TABS: ORAL_TABLET | ORAL | 3 refills | Status: DC

## 2019-10-29 NOTE — Assessment & Plan Note (Signed)
This is a pleasant 39 year old female, she is a complex history of polyneuropathy, negative brain MRI, negative autoimmune labs, cervical and lumbar spine MRI from 2019 show age-appropriate degenerative findings, nerve conduction and EMG was negative, she did have a skin biopsy that showed sparsity of cutaneous nerve endings and thus has a diagnosis of small fiber neuropathy. Historically symptoms have been moderately well controlled with Lyrica, in the past gabapentin plus Lyrica together did treat intolerable side effects. More recently she is had increasing pain in her upper back between the scapulae. No precipitating factors. Adding 5 days of prednisone, formal physical therapy, meloxicam to be started after prednisone. Return to see me in 4 weeks, new MRI for epidural planning if no better.  We did discuss some of her frustrations with the diagnosis of a small fiber neuropathy and the lack of ability of the medical profession as a whole to explain this.  From a mood perspective things are adequate, she is understandably frustrated, she is seeing a psychiatrist, I do not think any further intervention is needed here.

## 2019-10-29 NOTE — Progress Notes (Signed)
    Procedures performed today:    None.  Independent interpretation of notes and tests performed by another provider:   None.  Impression and Recommendations:    Cervical spondylosis without myelopathy This is a pleasant 39 year old female, she is a complex history of polyneuropathy, negative brain MRI, negative autoimmune labs, cervical and lumbar spine MRI from 2019 show age-appropriate degenerative findings, nerve conduction and EMG was negative, she did have a skin biopsy that showed sparsity of cutaneous nerve endings and thus has a diagnosis of small fiber neuropathy. Historically symptoms have been moderately well controlled with Lyrica, in the past gabapentin plus Lyrica together did treat intolerable side effects. More recently she is had increasing pain in her upper back between the scapulae. No precipitating factors. Adding 5 days of prednisone, formal physical therapy, meloxicam to be started after prednisone. Return to see me in 4 weeks, new MRI for epidural planning if no better.  We did discuss some of her frustrations with the diagnosis of a small fiber neuropathy and the lack of ability of the medical profession as a whole to explain this.  From a mood perspective things are adequate, she is understandably frustrated, she is seeing a psychiatrist, I do not think any further intervention is needed here.    ___________________________________________ Gwen Her. Dianah Field, M.D., ABFM., CAQSM. Primary Care and [employer] Instructor of [employer] [facility]

## 2019-11-04 DIAGNOSIS — L600 Ingrowing nail: Secondary | ICD-10-CM | POA: Diagnosis not present

## 2019-11-05 ENCOUNTER — Encounter: Payer: Self-pay | Admitting: Rehabilitative and Restorative Service Providers"

## 2019-11-05 ENCOUNTER — Ambulatory Visit (INDEPENDENT_AMBULATORY_CARE_PROVIDER_SITE_OTHER): Payer: BC Managed Care – PPO | Admitting: Rehabilitative and Restorative Service Providers"

## 2019-11-05 ENCOUNTER — Other Ambulatory Visit: Payer: Self-pay

## 2019-11-05 DIAGNOSIS — R29898 Other symptoms and signs involving the musculoskeletal system: Secondary | ICD-10-CM

## 2019-11-05 DIAGNOSIS — M542 Cervicalgia: Secondary | ICD-10-CM | POA: Diagnosis not present

## 2019-11-05 DIAGNOSIS — M546 Pain in thoracic spine: Secondary | ICD-10-CM | POA: Diagnosis not present

## 2019-11-05 NOTE — Patient Instructions (Signed)
Access Code: [email or web address]: [email or web address]: 03/17/2021Prepared by: Carita Sollars HoltExercises  Seated Cervical Retraction - 3 x daily - 7 x weekly - 10 reps - 1 sets  Standing Scapular Retraction - 3 x daily - 7 x weekly - 10 reps - 1 sets - 10 hold  Doorway Pec Stretch at 60 Degrees Abduction - 3 x daily - 7 x weekly - 3 reps - 1 sets  Doorway Pec Stretch at 90 Degrees Abduction - 3 x daily - 7 x weekly - 3 reps - 1 sets - 30 seconds hold  Doorway Pec Stretch at 120 Degrees Abduction - 3 x daily - 7 x weekly - 3 reps - 1 sets - 30 second hold hold Patient Education  TENS Unit

## 2019-11-05 NOTE — Therapy (Addendum)
[facility] [place], [place], [address] Phone: [phone]   Fax:  [phone]  Physical Therapy Evaluation  Patient Details  Name: Tara Velazquez MRN: [id number] Date of Birth: 02/24/1981 Referring Provider (PT): Dr Dianah Field    Encounter Date: 11/05/2019  PT End of Session - 11/05/19 1247    Visit Number  1    Number of Visits  12    Date for PT Re-Evaluation  12/16/19    PT Start Time  1147    PT Stop Time  [id number]    PT Time Calculation (min)  55 min       Past Medical History:  Diagnosis Date  . Abnormal Pap smear of cervix 03-08-15   --normal pap:Pos HR HPV w/#16 Pos genotype--colposcopy ECC at least high grade dysplasia;cannot exclude adenocarcinoma insitu  . Anemia    d/t heavy menstrual cycles  . Anxiety   . Cancer ([place])    cervical pre cancerous cell  . Cervical scoliosis 02/04/2018  . Congenital nystagmus 06/18/2017  . Depression   . Dysmenorrhea   . Eczema   . GERD (gastroesophageal reflux disease) 06/23/2014  . Nystagmus    her whole life, sees [facility]  . Nystagmus   . Small fiber neuropathy   . Urticaria     Past Surgical History:  Procedure Laterality Date  . ABDOMINAL HYSTERECTOMY  2018   R-TLH w/Bil.Salpingectomy  . CERVICAL CONIZATION W/BX N/A 04/27/2015   Procedure: CONIZATION CERVIX WITH BIOPSY with ECC;  Surgeon: Nunzio Cobbs, MD;  Location: [facility];  Service: Gynecology;  Laterality: N/A;  . CESAREAN SECTION     2009, 2006, and 2004  . CYSTOSCOPY N/A 06/22/2015   Procedure: CYSTOSCOPY;  Surgeon: Nunzio Cobbs, MD;  Location: [facility];  Service: Gynecology;  Laterality: N/A;  . WISDOM TOOTH EXTRACTION      There were no vitals filed for this visit.   Subjective Assessment - 11/05/19 1147    Subjective  Patient reports that her neck hurts on and off "all the time" for 2-3 years with no known injury. MRI showed mild arthritis. In the past month  she has noteced pain in the upper back which is constant in nature. She does not remember any accident or injury. Back symptoms have improved some with meds.    Pertinent History  polyneuropathy ~ past 7 years; bipolar    Patient Stated Goals  does not want to hurt every time she moves    Currently in Pain?  Yes    Pain Score  3     Pain Location  Back    Pain Orientation  Upper;Left;Lateral    Pain Descriptors / Indicators  Constant;Stabbing    Pain Type  Acute pain    Pain Radiating Towards  toward Lt arm and leg    Pain Onset  More than a month ago    Pain Frequency  Constant    Aggravating Factors   moving; sometimes sitting; driving    Pain Relieving Factors  nothing         [facility] PT Assessment - 11/05/19 0001      Assessment   Medical Diagnosis  Thoracic pain; cervical spondylosis    Referring Provider (PT)  Dr Dianah Field     Onset Date/Surgical Date  10/01/19    Hand Dominance  Right    Next MD Visit  f/u in a few weeks no appt scheduled  Prior Therapy  none       Precautions   Precautions  None      Balance Screen   Has the patient fallen in the past 6 months  No    Has the patient had a decrease in activity level because of a fear of falling?   No    Is the patient reluctant to leave their home because of a fear of falling?   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     Leisure  household chores; walking 20-30 min every day       Observation/Other Assessments   Focus on Therapeutic Outcomes (FOTO)   37% limitation       Sensation   Additional Comments  numbness hands and feet for years d/t polyneuropathy       Posture/Postural Control   Posture Comments  head forward; shoudlers rounded and elevated       AROM    Right/Left Shoulder  --   tight end ranges bilat    Cervical Flexion  46    Cervical Extension  35    Cervical - Right Side Bend  29    Cervical - Left Side Bend  23    Cervical - Right Rotation  63    Cervical - Left Rotation  65      Palpation   Spinal mobility  tender and tight with PA mobs through the lower cervical and upper/mid thoracic spine     Palpation comment  muscular tightness through the pecs; upper trap/leveator; teres       Special Tests   Other special tests  cervical compression (-) distraction (-) but feels good                 Objective measurements completed on examination: See above findings.              PT Education - 11/05/19 1221    Education Details  HEP TENS    Person(s) Educated  Patient    Methods  Explanation;Demonstration;Tactile cues;Verbal cues;Handout    Comprehension  Verbalized understanding;Returned demonstration;Verbal cues required;Tactile cues required          PT Long Term Goals - 11/05/19 1235      PT LONG TERM GOAL #1   Title  Improve posture and alignment with patient to demonstrate improved upright posture with posterior shoulder girdle musculature activiated    Time  6    Period  Weeks    Status  New    Target Date  12/16/19      PT LONG TERM GOAL #2   Title  Improve cervical AROM in lateral flexio by 5-7 degrees and thoracic extension    Time  6    Period  Weeks    Status  New    Target Date  12/16/19      PT LONG TERM GOAL #3   Title  Decrease pain intensity thoracic spine by 50-75% allowing patient to increase ADL's    Time  6    Period  Weeks    Status  New    Target Date  12/16/19      PT LONG  TERM GOAL #4   Title  Independent in HEP    Time  6    Period  Weeks    Status  New    Target Date  12/16/19      PT LONG TERM GOAL #5   Title  Improve FOTO to </= 29% limitation    Time  6    Period  Weeks    Status  New    Target Date  12/16/19             Plan - 11/05/19 1221     Clinical Impression Statement  Patient presents with mid to upper back pain for the past month with no known injury. She has a history of cervical dysfunctin/pain for the past 2-3 years. Patient has poor cervical and thoracic posture with scapulae rounded and elevated along the thoracic wall; muscular and spine tenderness and tightness to palpation; pain in mid thoracic to cervical spine with some radicular pain in the Le U/LE's. Paitent will benefit from PT to address problems identified    Stability/Clinical Decision Making  Stable/Uncomplicated    Clinical Decision Making  Low    Rehab Potential  Good    PT Frequency  2x / week    PT Duration  6 weeks    PT Treatment/Interventions  Taping;ADLs/Self Care Home Management;Patient/family education;Cryotherapy;Electrical Stimulation;Iontophoresis 4mg /ml Dexamethasone;Moist Heat;Traction;Ultrasound;Therapeutic activities;Therapeutic exercise;Neuromuscular re-education;Manual techniques;Dry needling    PT Next Visit Plan  review HEP; mob thoracic spine; manual work thoracic and periscapular musculature into cervical area; postural correction; progress with HEP as indicated; modalities as indicated    PT Home Exercise Plan  [id number]    Consulted and Agree with Plan of Care  Patient       Patient will benefit from skilled therapeutic intervention in order to improve the following deficits and impairments:     Visit Diagnosis: Pain in thoracic spine - Plan: PT plan of care cert/re-cert  Cervicalgia - Plan: PT plan of care cert/re-cert  Other symptoms and signs involving the musculoskeletal system - Plan: PT plan of care cert/re-cert     Problem List Patient Active Problem List   Diagnosis Date Noted  . Sensation of throat constriction 04/03/2019  . GI symptoms 04/03/2019  . Recurrent dermatitis 04/03/2019  . Small fiber neuropathy 09/23/2018  . Chronic pain disorder 09/08/2018  . Cervical spondylosis without myelopathy 09/08/2018  .  Idiopathic small fiber sensory neuropathy 09/08/2018  . Abdominal pain, chronic, left upper quadrant 09/08/2018  . Cervical scoliosis 02/04/2018  . Congenital nystagmus 06/18/2017  . Chest pain 10/04/2016  . Right-sided chest wall pain 10/04/2016  . Anxiety and depression 10/04/2016  . History of bipolar disorder 10/04/2016  . Fatigue 10/04/2016  . Agoraphobia 10/04/2016  . Status post laparoscopic hysterectomy 06/22/2015  . Elevated LFTs 06/23/2014  . Fibrocystic breast 06/23/2014  . GERD (gastroesophageal reflux disease) 06/23/2014    Matelyn Antonelli Nilda Simmer PT, MPH  11/05/2019, 12:48 PM  [facility] [place], [place], [address] Phone: [phone]   Fax:  [phone]  Name: Estaline Friedrichs MRN: [id number] Date of Birth: Apr 21, 1981

## 2019-11-10 ENCOUNTER — Ambulatory Visit (INDEPENDENT_AMBULATORY_CARE_PROVIDER_SITE_OTHER): Payer: BC Managed Care – PPO | Admitting: Rehabilitative and Restorative Service Providers"

## 2019-11-10 ENCOUNTER — Other Ambulatory Visit: Payer: Self-pay

## 2019-11-10 ENCOUNTER — Encounter: Payer: Self-pay | Admitting: Rehabilitative and Restorative Service Providers"

## 2019-11-10 DIAGNOSIS — M542 Cervicalgia: Secondary | ICD-10-CM | POA: Diagnosis not present

## 2019-11-10 DIAGNOSIS — M546 Pain in thoracic spine: Secondary | ICD-10-CM

## 2019-11-10 DIAGNOSIS — R29898 Other symptoms and signs involving the musculoskeletal system: Secondary | ICD-10-CM | POA: Diagnosis not present

## 2019-11-10 NOTE — Patient Instructions (Signed)
Resisted External Rotation: in Neutral - Bilateral   PALMS UP Sit or stand, tubing in both hands, elbows at sides, bent to 90, forearms forward. Pinch shoulder blades together and rotate forearms out. Keep elbows at sides. Repeat __10__ times per set. Do _2-3___ sets per session. Do _2-3___ sessions per day.  Trial of lying with noodle along spine - head supported on pillows  May try bringing arms out to side supported on surface  Start with 1 min and work up as tolerated to ~5 min

## 2019-11-10 NOTE — Therapy (Signed)
[facility] [place], [place], [address] Phone: [phone]   Fax:  [phone]  Physical Therapy Treatment  Patient Details  Name: Tara Velazquez MRN: [id number] Date of Birth: 1981-04-05 Referring Provider (PT): Dr Dianah Field    Encounter Date: 11/10/2019  PT End of Session - 11/10/19 1023    Visit Number  2    Number of Visits  12    Date for PT Re-Evaluation  12/16/19    PT Start Time  1019    PT Stop Time  1110    PT Time Calculation (min)  51 min    Activity Tolerance  Patient tolerated treatment well       Past Medical History:  Diagnosis Date  . Abnormal Pap smear of cervix 03-08-15   --normal pap:Pos HR HPV w/#16 Pos genotype--colposcopy ECC at least high grade dysplasia;cannot exclude adenocarcinoma insitu  . Anemia    d/t heavy menstrual cycles  . Anxiety   . Cancer ([place])    cervical pre cancerous cell  . Cervical scoliosis 02/04/2018  . Congenital nystagmus 06/18/2017  . Depression   . Dysmenorrhea   . Eczema   . GERD (gastroesophageal reflux disease) 06/23/2014  . Nystagmus    her whole life, sees [place]  . Nystagmus   . Small fiber neuropathy   . Urticaria     Past Surgical History:  Procedure Laterality Date  . ABDOMINAL HYSTERECTOMY  2018   R-TLH w/Bil.Salpingectomy  . CERVICAL CONIZATION W/BX N/A 04/27/2015   Procedure: CONIZATION CERVIX WITH BIOPSY with ECC;  Surgeon: Nunzio Cobbs, MD;  Location: [facility];  Service: Gynecology;  Laterality: N/A;  . CESAREAN SECTION     2009, 2006, and 2004  . CYSTOSCOPY N/A 06/22/2015   Procedure: CYSTOSCOPY;  Surgeon: Nunzio Cobbs, MD;  Location: [facility];  Service: Gynecology;  Laterality: N/A;  . WISDOM TOOTH EXTRACTION      There were no vitals filed for this visit.  Subjective Assessment - 11/10/19 1023    Subjective  Exercises are going well and pt is doing them daily. Doing about like usual until Saturday  - increased pain which she feels was related to household chores and cooking    Currently in Pain?  Yes    Pain Score  3     Pain Location  Back    Pain Orientation  Upper;Left;Lateral    Pain Descriptors / Indicators  Constant;Aching    Pain Type  Acute pain    Pain Radiating Towards  toward the Lt arm and leg    Pain Onset  More than a month ago    Pain Frequency  Constant                       [facility] Adult PT Treatment/Exercise - 11/10/19 0001      Therapeutic Activites    Therapeutic Activities  --   myofacial ball release with ball      Neuro Re-ed    Neuro Re-ed Details   working on posture and alignment       Shoulder Exercises: Standing   External Rotation  Strengthening;Both;15 reps;Theraband   w/ noodle to encourage posterior shoulder girdle activation   Theraband Level (Shoulder External Rotation)  Level 1 (Yellow)    Retraction  AROM;Strengthening;Both;10 reps   with noodle UE  in ER    Other Standing Exercises  scap squeeze 10 sec x  10 with noodle       Shoulder Exercises: Stretch   Other Shoulder Stretches  doorway stretch 3 positions x 2 reps 30 sec hold     Other Shoulder Stretches  prolonged snow angel with noodle along spine ~ 45-60 sec tolerance       Moist Heat Therapy   Number Minutes Moist Heat  14 Minutes    Moist Heat Location  Shoulder   anterior/posterior shoulder      Electrical Stimulation   Electrical Stimulation Location  Lt shoulder girdle     Electrical Stimulation Action  IFC    Electrical Stimulation Parameters  to tolerance    Electrical Stimulation Goals  Pain;Tone      Manual Therapy   Manual therapy comments  pt sitting and supine     Joint Mobilization  circumduction Lt GH joint; stretching into T-spine extension pt sitting with noodle along thoracic spine PT applying pull through the anterior shoulders     Soft tissue mobilization  deep tissue work through the Lt shoulder girdle     Myofascial Release  anterior  chest/anterior deltoid    Scapular Mobilization  Lt              PT Education - 11/10/19 1102    Education Details  HEP    Person(s) Educated  Patient    Methods  Explanation;Demonstration;Tactile cues;Verbal cues;Handout    Comprehension  Verbalized understanding;Returned demonstration;Verbal cues required;Tactile cues required          PT Long Term Goals - 11/05/19 1235      PT LONG TERM GOAL #1   Title  Improve posture and alignment with patient to demonstrate improved upright posture with posterior shoulder girdle musculature activiated    Time  6    Period  Weeks    Status  New    Target Date  12/16/19      PT LONG TERM GOAL #2   Title  Improve cervical AROM in lateral flexio by 5-7 degrees and thoracic extension    Time  6    Period  Weeks    Status  New    Target Date  12/16/19      PT LONG TERM GOAL #3   Title  Decrease pain intensity thoracic spine by 50-75% allowing patient to increase ADL's    Time  6    Period  Weeks    Status  New    Target Date  12/16/19      PT LONG TERM GOAL #4   Title  Independent in HEP    Time  6    Period  Weeks    Status  New    Target Date  12/16/19      PT LONG TERM GOAL #5   Title  Improve FOTO to </= 29% limitation    Time  6    Period  Weeks    Status  New    Target Date  12/16/19            Plan - 11/10/19 1103    Clinical Impression Statement  Persistent pain - patient has difficulty with proper technique with exercise. Added posterior shoulder girdle strengthening; workin gon posture and alignment; thoracic mobs in sitting    Rehab Potential  Good    PT Frequency  2x / week    PT Duration  6 weeks    PT Treatment/Interventions  Taping;ADLs/Self Care Home Management;Patient/family education;Cryotherapy;Electrical Stimulation;Iontophoresis 4mg /ml Dexamethasone;Moist Heat;Traction;Ultrasound;Therapeutic activities;Therapeutic exercise;Neuromuscular  re-education;Manual techniques;Dry needling    PT Next  Visit Plan  review HEP; mob thoracic spine; manual work thoracic and periscapular musculature into cervical area; postural correction; progress with HEP as indicated; modalities as indicated    PT Home Exercise Plan  [id number]; VHI    Consulted and Agree with Plan of Care  Patient       Patient will benefit from skilled therapeutic intervention in order to improve the following deficits and impairments:     Visit Diagnosis: Pain in thoracic spine  Cervicalgia  Other symptoms and signs involving the musculoskeletal system     Problem List Patient Active Problem List   Diagnosis Date Noted  . Sensation of throat constriction 04/03/2019  . GI symptoms 04/03/2019  . Recurrent dermatitis 04/03/2019  . Small fiber neuropathy 09/23/2018  . Chronic pain disorder 09/08/2018  . Cervical spondylosis without myelopathy 09/08/2018  . Idiopathic small fiber sensory neuropathy 09/08/2018  . Abdominal pain, chronic, left upper quadrant 09/08/2018  . Cervical scoliosis 02/04/2018  . Congenital nystagmus 06/18/2017  . Chest pain 10/04/2016  . Right-sided chest wall pain 10/04/2016  . Anxiety and depression 10/04/2016  . History of bipolar disorder 10/04/2016  . Fatigue 10/04/2016  . Agoraphobia 10/04/2016  . Status post laparoscopic hysterectomy 06/22/2015  . Elevated LFTs 06/23/2014  . Fibrocystic breast 06/23/2014  . GERD (gastroesophageal reflux disease) 06/23/2014    Gazelle Towe Nilda Simmer PT, MPH  11/10/2019, 11:05 AM  [facility] [place], [place], [address] Phone: [phone]   Fax:  [phone]  Name: Kaitlee Fralin MRN: [id number] Date of Birth: 01-31-1981

## 2019-11-12 ENCOUNTER — Encounter: Payer: BC Managed Care – PPO | Admitting: Rehabilitative and Restorative Service Providers"

## 2019-11-17 ENCOUNTER — Other Ambulatory Visit: Payer: Self-pay

## 2019-11-17 ENCOUNTER — Ambulatory Visit (INDEPENDENT_AMBULATORY_CARE_PROVIDER_SITE_OTHER): Payer: BC Managed Care – PPO | Admitting: Rehabilitative and Restorative Service Providers"

## 2019-11-17 DIAGNOSIS — M542 Cervicalgia: Secondary | ICD-10-CM | POA: Diagnosis not present

## 2019-11-17 DIAGNOSIS — M546 Pain in thoracic spine: Secondary | ICD-10-CM | POA: Diagnosis not present

## 2019-11-17 DIAGNOSIS — R29898 Other symptoms and signs involving the musculoskeletal system: Secondary | ICD-10-CM | POA: Diagnosis not present

## 2019-11-17 NOTE — Patient Instructions (Signed)
Resisted External Rotation: in Neutral - Bilateral   PALMS UP Sit or stand, tubing in both hands, elbows at sides, bent to 90, forearms forward. Pinch shoulder blades together and rotate forearms out. Keep elbows at sides. Repeat __10__ times per set. Do _2-3___ sets per session. Do _2-3___ sessions per day.   Low Row: Standing   Face anchor, feet shoulder width apart. Palms up, pull arms back, squeezing shoulder blades together. Repeat 10__ times per set. Do 2-3__ sets per session. Do 2-3__ sessions per week. Anchor Height: Waist   Bow and arrow - stepping back with red bane 10 reps each side x 2 sets   Strengthening: Resisted Extension   Hold tubing in right hand, arm forward. Pull arm back, elbow straight. Repeat _10___ times per set. Do 2-3____ sets per session. Do 2-3____ sessions per day.  Plank on wall    With palms flat on wall, shoulder-width apart, and elbows straight, tighten core hold 30-60 sec . Return. Repeat __3-5 __ times Do __2__ sessions per day.

## 2019-11-17 NOTE — Therapy (Signed)
[place], [place], [address] Phone: [phone]   Fax:  [phone]  Physical Therapy Treatment  Patient Details  Name: Tara Velazquez MRN: [id number] Date of Birth: [birth year] Referring Provider (PT): Dr Dianah Field    Encounter Date: 11/17/2019  PT End of Session - 11/17/19 1013    Visit Number  3    Number of Visits  12    Date for PT Re-Evaluation  12/16/19    PT Start Time  [id number]    PT Stop Time  1100    PT Time Calculation (min)  46 min    Activity Tolerance  Patient tolerated treatment well       Past Medical History:  Diagnosis Date  . Abnormal Pap smear of cervix 03-08-15   --normal pap:Pos HR HPV w/#16 Pos genotype--colposcopy ECC at least high grade dysplasia;cannot exclude adenocarcinoma insitu  . Anemia    d/t heavy menstrual cycles  . Anxiety   . Cancer ([place])    cervical pre cancerous cell  . Cervical scoliosis 02/04/2018  . Congenital nystagmus 06/18/2017  . Depression   . Dysmenorrhea   . Eczema   . GERD (gastroesophageal reflux disease) 06/23/2014  . Nystagmus    her whole life, sees [place]  . Nystagmus   . Small fiber neuropathy   . Urticaria     Past Surgical History:  Procedure Laterality Date  . ABDOMINAL HYSTERECTOMY  2018   R-TLH w/Bil.Salpingectomy  . CERVICAL CONIZATION W/BX N/A 04/27/2015   Procedure: CONIZATION CERVIX WITH BIOPSY with ECC;  Surgeon: Nunzio Cobbs, MD;  Location: [facility];  Service: Gynecology;  Laterality: N/A;  . CESAREAN SECTION     2009, 2006, and 2004  . CYSTOSCOPY N/A 06/22/2015   Procedure: CYSTOSCOPY;  Surgeon: Nunzio Cobbs, MD;  Location: [facility];  Service: Gynecology;  Laterality: N/A;  . WISDOM TOOTH EXTRACTION      There were no vitals filed for this visit.  Subjective Assessment - 11/17/19 1014    Subjective  Awoke with headache which is "pretty normal" gets a lot of headaches. mid and low back  seem to be doing "ok" today.    Currently in Pain?  Yes    Pain Score  2     Pain Location  Back    Pain Orientation  Left;Mid    Pain Descriptors / Indicators  Aching    Pain Type  Acute pain    Pain Onset  More than a month ago    Pain Frequency  Intermittent                       [facility] Adult PT Treatment/Exercise - 11/17/19 0001      Neuro Re-ed    Neuro Re-ed Details   working on posture and alignment       Shoulder Exercises: Standing   External Rotation  Strengthening;Both;15 reps;Theraband   w/ noodle to encourage posterior shoulder girdle activation   Theraband Level (Shoulder External Rotation)  Level 1 (Yellow)    Extension  Strengthening;Both;20 reps;Theraband    Theraband Level (Shoulder Extension)  Level 2 (Red)    Row  Strengthening;Both;20 reps;Theraband    Theraband Level (Shoulder Row)  Level 2 (Red)    Row Limitations  step back bow and arrow x 10 each side - red TB     Retraction  AROM;Strengthening;Both;10 reps   with noodle UE  in ER    Other Standing Exercises  scap squeeze 10 sec x 10 with noodle       Shoulder Exercises: ROM/Strengthening   Nustep  L4 x 5 min UE- 9     Plank  60 seconds;2 reps   at wall      Moist Heat Therapy   Number Minutes Moist Heat  12 Minutes    Moist Heat Location  Shoulder   anterior/posterior shoulder      Electrical Stimulation   Electrical Stimulation Location  bilat upper traps     Electrical Stimulation Action  IFC    Electrical Stimulation Parameters  to tolerance    Electrical Stimulation Goals  Pain;Tone      Manual Therapy   Manual therapy comments  pt supine     Soft tissue mobilization  deep tissue work through the bilat shoulder girdles; upper trap; leveator; pecs; cervical musculature      Passive ROM  stretch into cervical flexion 10-15 sec x 1 rep     Manual Traction  cervical traction 15-20 sec hold x 1 rep              PT Education - 11/17/19 1031    Education Details  HEP     Person(s) Educated  Patient    Methods  Explanation;Demonstration;Tactile cues;Verbal cues;Handout    Comprehension  Verbalized understanding;Returned demonstration;Verbal cues required;Tactile cues required          PT Long Term Goals - 11/05/19 1235      PT LONG TERM GOAL #1   Title  Improve posture and alignment with patient to demonstrate improved upright posture with posterior shoulder girdle musculature activiated    Time  6    Period  Weeks    Status  New    Target Date  12/16/19      PT LONG TERM GOAL #2   Title  Improve cervical AROM in lateral flexio by 5-7 degrees and thoracic extension    Time  6    Period  Weeks    Status  New    Target Date  12/16/19      PT LONG TERM GOAL #3   Title  Decrease pain intensity thoracic spine by 50-75% allowing patient to increase ADL's    Time  6    Period  Weeks    Status  New    Target Date  12/16/19      PT LONG TERM GOAL #4   Title  Independent in HEP    Time  6    Period  Weeks    Status  New    Target Date  12/16/19      PT LONG TERM GOAL #5   Title  Improve FOTO to </= 29% limitation    Time  6    Period  Weeks    Status  New    Target Date  12/16/19            Plan - 11/17/19 1052    Clinical Impression Statement  Pain in mid to LB some improved but patient has HA today - reports that she has something that hurts every day. Added posterior shoulder girdle strengthening and core stabilization today. Will benefit form continued treatment to progress with core stabilization and strengthening.    Rehab Potential  Good    PT Frequency  2x / week    PT Duration  6 weeks    PT Treatment/Interventions  Taping;ADLs/Self Care  Home Management;Patient/family education;Cryotherapy;Electrical Stimulation;Iontophoresis 4mg /ml Dexamethasone;Moist Heat;Traction;Ultrasound;Therapeutic activities;Therapeutic exercise;Neuromuscular re-education;Manual techniques;Dry needling    PT Next Visit Plan  review HEP; mob thoracic  spine; manual work thoracic and periscapular musculature into cervical area; postural correction; progress with HEP as indicated; modalities as indicated    PT Home Exercise Plan  [id number]; VHI    Consulted and Agree with Plan of Care  Patient       Patient will benefit from skilled therapeutic intervention in order to improve the following deficits and impairments:     Visit Diagnosis: Pain in thoracic spine  Cervicalgia  Other symptoms and signs involving the musculoskeletal system     Problem List Patient Active Problem List   Diagnosis Date Noted  . Sensation of throat constriction 04/03/2019  . GI symptoms 04/03/2019  . Recurrent dermatitis 04/03/2019  . Small fiber neuropathy 09/23/2018  . Chronic pain disorder 09/08/2018  . Cervical spondylosis without myelopathy 09/08/2018  . Idiopathic small fiber sensory neuropathy 09/08/2018  . Abdominal pain, chronic, left upper quadrant 09/08/2018  . Cervical scoliosis 02/04/2018  . Congenital nystagmus 06/18/2017  . Chest pain 10/04/2016  . Right-sided chest wall pain 10/04/2016  . Anxiety and depression 10/04/2016  . History of bipolar disorder 10/04/2016  . Fatigue 10/04/2016  . Agoraphobia 10/04/2016  . Status post laparoscopic hysterectomy 06/22/2015  . Elevated LFTs 06/23/2014  . Fibrocystic breast 06/23/2014  . GERD (gastroesophageal reflux disease) 06/23/2014    Tara Velazquez PT, MPH  11/17/2019, 10:54 AM  [facility] [place], [place], [address] Phone: [phone]   Fax:  [phone]  Name: Tara Velazquez MRN: [id number] Date of Birth: 1981-05-01

## 2019-11-20 ENCOUNTER — Ambulatory Visit (INDEPENDENT_AMBULATORY_CARE_PROVIDER_SITE_OTHER): Payer: BC Managed Care – PPO | Admitting: Rehabilitative and Restorative Service Providers"

## 2019-11-20 ENCOUNTER — Other Ambulatory Visit: Payer: Self-pay

## 2019-11-20 ENCOUNTER — Encounter: Payer: Self-pay | Admitting: Rehabilitative and Restorative Service Providers"

## 2019-11-20 DIAGNOSIS — M542 Cervicalgia: Secondary | ICD-10-CM

## 2019-11-20 DIAGNOSIS — M546 Pain in thoracic spine: Secondary | ICD-10-CM

## 2019-11-20 DIAGNOSIS — R29898 Other symptoms and signs involving the musculoskeletal system: Secondary | ICD-10-CM | POA: Diagnosis not present

## 2019-11-20 NOTE — Therapy (Signed)
[place], [place], [address] Phone: [phone]   Fax:  [phone]  Physical Therapy Treatment  Patient Details  Name: Tara Velazquez MRN: [id number] Date of Birth: 10/18/1980 Referring Provider (PT): Dr Dianah Field    Encounter Date: 11/20/2019  PT End of Session - 11/20/19 0932    Visit Number  4    Number of Visits  12    Date for PT Re-Evaluation  12/16/19    PT Start Time  0930    PT Stop Time  1017    PT Time Calculation (min)  47 min    Activity Tolerance  Patient tolerated treatment well       Past Medical History:  Diagnosis Date  . Abnormal Pap smear of cervix 03-08-15   --normal pap:Pos HR HPV w/#16 Pos genotype--colposcopy ECC at least high grade dysplasia;cannot exclude adenocarcinoma insitu  . Anemia    d/t heavy menstrual cycles  . Anxiety   . Cancer ([place])    cervical pre cancerous cell  . Cervical scoliosis 02/04/2018  . Congenital nystagmus 06/18/2017  . Depression   . Dysmenorrhea   . Eczema   . GERD (gastroesophageal reflux disease) 06/23/2014  . Nystagmus    her whole life, sees [place]  . Nystagmus   . Small fiber neuropathy   . Urticaria     Past Surgical History:  Procedure Laterality Date  . ABDOMINAL HYSTERECTOMY  2018   R-TLH w/Bil.Salpingectomy  . CERVICAL CONIZATION W/BX N/A 04/27/2015   Procedure: CONIZATION CERVIX WITH BIOPSY with ECC;  Surgeon: Nunzio Cobbs, MD;  Location: [facility];  Service: Gynecology;  Laterality: N/A;  . CESAREAN SECTION     2009, 2006, and 2004  . CYSTOSCOPY N/A 06/22/2015   Procedure: CYSTOSCOPY;  Surgeon: Nunzio Cobbs, MD;  Location: [facility];  Service: Gynecology;  Laterality: N/A;  . WISDOM TOOTH EXTRACTION      There were no vitals filed for this visit.  Subjective Assessment - 11/20/19 0932    Subjective  Patient reports that she has continued to have some headaches on and off. Back has not been  bad - some pain last night for a couple of minutes when she laid and then resolved.    Currently in Pain?  No/denies    Pain Location  Back         [facility] PT Assessment - 11/20/19 0001      Assessment   Medical Diagnosis  Thoracic pain; cervical spondylosis    Referring Provider (PT)  Dr Dianah Field     Onset Date/Surgical Date  10/01/19    Hand Dominance  Right    Next MD Visit  f/u in a few weeks no appt scheduled     Prior Therapy  none       Precautions   Precautions  None      AROM   Cervical Flexion  51    Cervical Extension  44    Cervical - Right Side Bend  37    Cervical - Left Side Bend  30    Cervical - Right Rotation  63    Cervical - Left Rotation  66      Palpation   Spinal mobility  tender and tight with PA mobs through the lower cervical and upper/mid thoracic spine     Palpation comment  muscular tightness through the pecs; upper trap/leveator; teres  [facility] Adult PT Treatment/Exercise - 11/20/19 0001      Shoulder Exercises: Standing   External Rotation  Strengthening;Both;15 reps;Theraband   w/ noodle to encourage posterior shoulder girdle activation   Theraband Level (Shoulder External Rotation)  Level 1 (Yellow)    Extension  Strengthening;Both;20 reps;Theraband    Theraband Level (Shoulder Extension)  Level 2 (Red)    Row  Strengthening;Both;20 reps;Theraband    Theraband Level (Shoulder Row)  Level 2 (Red)    Row Limitations  step back bow and arrow x 10 each side - red TB     Retraction  AROM;Strengthening;Both;10 reps   with noodle UE  in ER    Other Standing Exercises  scap squeeze 10 sec x 10 with noodle       Shoulder Exercises: Therapy Ball   Flexion Limitations  shoulder flexion stepping side to side ball overhead x 2 min       Shoulder Exercises: ROM/Strengthening   UBE (Upper Arm Bike)  L2 - 4 min alternating fwd/back       Shoulder Exercises: Stretch   Star Gazer Stretch  --   neural mobilzation  bilat UE's 1 min x 2 reps supine    Other Shoulder Stretches  doorway stretch 3 positions x 2 reps 30 sec hold     Other Shoulder Stretches  prolonged snow angel with noodle along spine ~ 45-60 sec tolerance       Shoulder Exercises: Body Blade   Flexion  30 seconds;2 reps   UE at side bilat - more difficult on Rt    Other Body Blade Exercises  bilat UE's hands in front x 30 sec; then arms moving chest to head height x 30 sec       Modalities   Modalities  --   pt declined modalities      Manual Therapy   Manual Therapy  --   pt does not like massage - "but you're OK"    Manual therapy comments  pt supine     Soft tissue mobilization  deep tissue work through the bilat shoulder girdles; upper trap; leveator; pecs; cervical musculature - through SCM and scaleni to pt tolerance     Manual Traction  hold cervical traction - not a positive response              PT Education - 11/20/19 1011    Education Details  HEP    Person(s) Educated  Patient    Methods  Explanation;Demonstration;Tactile cues;Verbal cues;Handout    Comprehension  Verbalized understanding;Returned demonstration;Verbal cues required;Tactile cues required          PT Long Term Goals - 11/05/19 1235      PT LONG TERM GOAL #1   Title  Improve posture and alignment with patient to demonstrate improved upright posture with posterior shoulder girdle musculature activiated    Time  6    Period  Weeks    Status  New    Target Date  12/16/19      PT LONG TERM GOAL #2   Title  Improve cervical AROM in lateral flexio by 5-7 degrees and thoracic extension    Time  6    Period  Weeks    Status  New    Target Date  12/16/19      PT LONG TERM GOAL #3   Title  Decrease pain intensity thoracic spine by 50-75% allowing patient to increase ADL's    Time  6  Period  Weeks    Status  New    Target Date  12/16/19      PT LONG TERM GOAL #4   Title  Independent in HEP    Time  6    Period  Weeks    Status   New    Target Date  12/16/19      PT LONG TERM GOAL #5   Title  Improve FOTO to </= 29% limitation    Time  6    Period  Weeks    Status  New    Target Date  12/16/19            Plan - 11/20/19 0945    Clinical Impression Statement  Pain in the mid and low back has been pretty good. No time for exercises at home. Patient demonstrates good gains in cervical ROM with some improvement in scapular contraction with exercises    PT Frequency  2x / week    PT Duration  6 weeks    PT Treatment/Interventions  Taping;ADLs/Self Care Home Management;Patient/family education;Cryotherapy;Electrical Stimulation;Iontophoresis 4mg /ml Dexamethasone;Moist Heat;Traction;Ultrasound;Therapeutic activities;Therapeutic exercise;Neuromuscular re-education;Manual techniques;Dry needling    PT Next Visit Plan  continue with strengthening and stabilization; mob thoracic spine and manual work thoracic and periscapular musculature into cervical area as needed; postural correction; progress with HEP as indicated; modalities as indicated (pt does not like massage)    PT Home Exercise Plan  [id number]; VHI    Consulted and Agree with Plan of Care  Patient       Patient will benefit from skilled therapeutic intervention in order to improve the following deficits and impairments:     Visit Diagnosis: Pain in thoracic spine  Cervicalgia  Other symptoms and signs involving the musculoskeletal system     Problem List Patient Active Problem List   Diagnosis Date Noted  . Sensation of throat constriction 04/03/2019  . GI symptoms 04/03/2019  . Recurrent dermatitis 04/03/2019  . Small fiber neuropathy 09/23/2018  . Chronic pain disorder 09/08/2018  . Cervical spondylosis without myelopathy 09/08/2018  . Idiopathic small fiber sensory neuropathy 09/08/2018  . Abdominal pain, chronic, left upper quadrant 09/08/2018  . Cervical scoliosis 02/04/2018  . Congenital nystagmus 06/18/2017  . Chest pain  10/04/2016  . Right-sided chest wall pain 10/04/2016  . Anxiety and depression 10/04/2016  . History of bipolar disorder 10/04/2016  . Fatigue 10/04/2016  . Agoraphobia 10/04/2016  . Status post laparoscopic hysterectomy 06/22/2015  . Elevated LFTs 06/23/2014  . Fibrocystic breast 06/23/2014  . GERD (gastroesophageal reflux disease) 06/23/2014    Atha Muradyan Nilda Simmer PT, MPH 11/20/2019, 10:34 AM  [facility] [place], [place], [address] Phone: [phone]   Fax:  [phone]  Name: Taneah Elbe MRN: [id number] Date of Birth: [birth year]

## 2019-11-20 NOTE — Patient Instructions (Signed)
Neuromuscular: Median Nerve Stretch - Supine    Lie with neck supported, tuck, tip and turn head to the side. Hold right arm out to side, elbow bent, thumb down, fingers and wrist bent back. Slowly straighten elbow to feel a stretch - NO PAIN  Hold for _60___ seconds. Repeat _2___ times per set. Do __2__ times a day

## 2019-11-24 ENCOUNTER — Encounter: Payer: BC Managed Care – PPO | Admitting: Physical Therapy

## 2019-11-27 ENCOUNTER — Other Ambulatory Visit: Payer: Self-pay

## 2019-11-27 ENCOUNTER — Encounter: Payer: Self-pay | Admitting: Physical Therapy

## 2019-11-27 ENCOUNTER — Ambulatory Visit (INDEPENDENT_AMBULATORY_CARE_PROVIDER_SITE_OTHER): Payer: BC Managed Care – PPO | Admitting: Physical Therapy

## 2019-11-27 DIAGNOSIS — M546 Pain in thoracic spine: Secondary | ICD-10-CM | POA: Diagnosis not present

## 2019-11-27 DIAGNOSIS — R29898 Other symptoms and signs involving the musculoskeletal system: Secondary | ICD-10-CM

## 2019-11-27 DIAGNOSIS — M542 Cervicalgia: Secondary | ICD-10-CM | POA: Diagnosis not present

## 2019-11-27 NOTE — Therapy (Signed)
[place], [place], [address] Phone: [phone]   Fax:  [phone]  Physical Therapy Treatment  Patient Details  Name: Tara Velazquez MRN: [id number] Date of Birth: 09/28/1980 Referring Provider (PT): Dr Dianah Field    Encounter Date: 11/27/2019  PT End of Session - 11/27/19 1018    Visit Number  5    Number of Visits  12    Date for PT Re-Evaluation  12/16/19    PT Start Time  1019    PT Stop Time  1100    PT Time Calculation (min)  41 min    Behavior During Therapy  [facility] for tasks assessed/performed       Past Medical History:  Diagnosis Date  . Abnormal Pap smear of cervix 03-08-15   --normal pap:Pos HR HPV w/#16 Pos genotype--colposcopy ECC at least high grade dysplasia;cannot exclude adenocarcinoma insitu  . Anemia    d/t heavy menstrual cycles  . Anxiety   . Cancer ([place])    cervical pre cancerous cell  . Cervical scoliosis 02/04/2018  . Congenital nystagmus 06/18/2017  . Depression   . Dysmenorrhea   . Eczema   . GERD (gastroesophageal reflux disease) 06/23/2014  . Nystagmus    her whole life, sees [place]  . Nystagmus   . Small fiber neuropathy   . Urticaria     Past Surgical History:  Procedure Laterality Date  . ABDOMINAL HYSTERECTOMY  2018   R-TLH w/Bil.Salpingectomy  . CERVICAL CONIZATION W/BX N/A 04/27/2015   Procedure: CONIZATION CERVIX WITH BIOPSY with ECC;  Surgeon: Nunzio Cobbs, MD;  Location: [facility];  Service: Gynecology;  Laterality: N/A;  . CESAREAN SECTION     2009, 2006, and 2004  . CYSTOSCOPY N/A 06/22/2015   Procedure: CYSTOSCOPY;  Surgeon: Nunzio Cobbs, MD;  Location: [facility];  Service: Gynecology;  Laterality: N/A;  . WISDOM TOOTH EXTRACTION      There were no vitals filed for this visit.  Subjective Assessment - 11/27/19 1248    Subjective  Pt reports she has been consistent with her HEP over last few days.  She has noticed a  little improvement.    Patient Stated Goals  does not want to hurt every time she moves    Currently in Pain?  Yes    Pain Score  7     Pain Location  Thoracic    Pain Orientation  Upper    Pain Descriptors / Indicators  Aching    Pain Radiating Towards  towards Lt arm and Lt leg    Aggravating Factors   sleep position, driving with hands on steering wheel    Pain Relieving Factors  heat         [facility] PT Assessment - 11/27/19 0001      Assessment   Medical Diagnosis  Thoracic pain; cervical spondylosis    Referring Provider (PT)  Dr Dianah Field     Onset Date/Surgical Date  10/01/19    Hand Dominance  Right    Next MD Visit  f/u in a few weeks no appt scheduled     Prior Therapy  none                    [facility] Adult PT Treatment/Exercise - 11/27/19 0001      Self-Care   Self-Care  Other Self-Care Comments    Other Self-Care Comments   pt educated on self massage  with theracane, ball, and wall corner to perform STM/ TPR to thoracic musculature; pt returned demo with cues.       Shoulder Exercises: Sidelying   Other Sidelying Exercises  open book Rt/Lt x 5 reps each direction, to tolerance      Shoulder Exercises: Standing   External Rotation  --   verbally reviewed.    Extension  --   verbally reviewed   Row  --   verbally reviewed.      Shoulder Exercises: ROM/Strengthening   UBE (Upper Arm Bike)  L2: alternating directions x 3.5 min, standing      Shoulder Exercises: Stretch   Other Shoulder Stretches  doorway stretch 3 positions x 1 reps 20 sec hold.  pec stretch with elbow straight at wall x 30 sec each side, then holding door x 20 sec each arm     Other Shoulder Stretches  Rt/Lt neural glide in supine with arm abdct 90 deg and head turned opp direction x 60 sec x 2 reps each side.   Thoracic ext over back of chair x 5 reps of 5 sec;  thoracic ext over small noodle in hooklying position with hands supporting head - x 15 sec (not tolerated)       Modalities   Modalities  --   pt declined; didn't feel IFC was helpful. will heat at home.     Manual Therapy   Manual therapy comments  pt prone    Soft tissue mobilization  Cross fiber friction to bilat thoracic paraspinals, STM and TPR to same area.  Gentle suboccipital release (held for only 10 sec)     Myofascial Release  MFR to bilat thoracic paraspinals                  PT Long Term Goals - 11/05/19 1235      PT LONG TERM GOAL #1   Title  Improve posture and alignment with patient to demonstrate improved upright posture with posterior shoulder girdle musculature activiated    Time  6    Period  Weeks    Status  New    Target Date  12/16/19      PT LONG TERM GOAL #2   Title  Improve cervical AROM in lateral flexio by 5-7 degrees and thoracic extension    Time  6    Period  Weeks    Status  New    Target Date  12/16/19      PT LONG TERM GOAL #3   Title  Decrease pain intensity thoracic spine by 50-75% allowing patient to increase ADL's    Time  6    Period  Weeks    Status  New    Target Date  12/16/19      PT LONG TERM GOAL #4   Title  Independent in HEP    Time  6    Period  Weeks    Status  New    Target Date  12/16/19      PT LONG TERM GOAL #5   Title  Improve FOTO to </= 29% limitation    Time  6    Period  Weeks    Status  New    Target Date  12/16/19            Plan - 11/27/19 1250    Clinical Impression Statement  Pt tolerated seated thoracic ext well, but not supine over small pool noodle.  Palpable tightness in Lt  thoracic paraspinals,and especially tight and tender near transverse process of T5-6. Pain remained unchanged with MFR and STM to this area. Improvement reported in overall pain; more intermittent rather than constant. Goals are ongoing at this time.    Rehab Potential  Good    PT Frequency  2x / week    PT Duration  6 weeks    PT Treatment/Interventions  Taping;ADLs/Self Care Home Management;Patient/family  education;Cryotherapy;Electrical Stimulation;Iontophoresis 4mg /ml Dexamethasone;Moist Heat;Traction;Ultrasound;Therapeutic activities;Therapeutic exercise;Neuromuscular re-education;Manual techniques;Dry needling    PT Next Visit Plan  continue with strengthening and stabilization; mob thoracic spine and manual work thoracic and periscapular musculature into cervical area as needed; postural correction; progress with HEP as indicated; modalities as indicated (pt does not like massage)    PT Home Exercise Plan  [id number]; VHI    Consulted and Agree with Plan of Care  Patient       Patient will benefit from skilled therapeutic intervention in order to improve the following deficits and impairments:     Visit Diagnosis: Pain in thoracic spine  Cervicalgia  Other symptoms and signs involving the musculoskeletal system     Problem List Patient Active Problem List   Diagnosis Date Noted  . Sensation of throat constriction 04/03/2019  . GI symptoms 04/03/2019  . Recurrent dermatitis 04/03/2019  . Small fiber neuropathy 09/23/2018  . Chronic pain disorder 09/08/2018  . Cervical spondylosis without myelopathy 09/08/2018  . Idiopathic small fiber sensory neuropathy 09/08/2018  . Abdominal pain, chronic, left upper quadrant 09/08/2018  . Cervical scoliosis 02/04/2018  . Congenital nystagmus 06/18/2017  . Chest pain 10/04/2016  . Right-sided chest wall pain 10/04/2016  . Anxiety and depression 10/04/2016  . History of bipolar disorder 10/04/2016  . Fatigue 10/04/2016  . Agoraphobia 10/04/2016  . Status post laparoscopic hysterectomy 06/22/2015  . Elevated LFTs 06/23/2014  . Fibrocystic breast 06/23/2014  . GERD (gastroesophageal reflux disease) 06/23/2014   Kerin Perna, PTA 11/27/19 1:05 PM  [facility] [place], [place], [address] Phone: [phone]   Fax:  [phone]  Name: Rhunette Moessner MRN:  [id number] Date of Birth: [birth year]

## 2019-12-02 ENCOUNTER — Encounter: Payer: Self-pay | Admitting: Physical Therapy

## 2019-12-02 ENCOUNTER — Ambulatory Visit (INDEPENDENT_AMBULATORY_CARE_PROVIDER_SITE_OTHER): Payer: BC Managed Care – PPO | Admitting: Physical Therapy

## 2019-12-02 ENCOUNTER — Other Ambulatory Visit: Payer: Self-pay

## 2019-12-02 DIAGNOSIS — M542 Cervicalgia: Secondary | ICD-10-CM

## 2019-12-02 DIAGNOSIS — M546 Pain in thoracic spine: Secondary | ICD-10-CM | POA: Diagnosis not present

## 2019-12-02 DIAGNOSIS — R29898 Other symptoms and signs involving the musculoskeletal system: Secondary | ICD-10-CM | POA: Diagnosis not present

## 2019-12-02 DIAGNOSIS — M47812 Spondylosis without myelopathy or radiculopathy, cervical region: Secondary | ICD-10-CM

## 2019-12-02 NOTE — Therapy (Signed)
[place], [place], [address] Phone: [phone]   Fax:  [phone]  Physical Therapy Treatment  Patient Details  Name: Tara Velazquez MRN: [id number] Date of Birth: [birth year] Referring Provider (PT): Dr Dianah Field    Encounter Date: 12/02/2019  PT End of Session - 12/02/19 0930    Visit Number  6    Number of Visits  12    Date for PT Re-Evaluation  12/16/19    PT Start Time  0930    PT Stop Time  1017    PT Time Calculation (min)  47 min    Activity Tolerance  Patient tolerated treatment well    Behavior During Therapy  [facility] for tasks assessed/performed       Past Medical History:  Diagnosis Date  . Abnormal Pap smear of cervix 03-08-15   --normal pap:Pos HR HPV w/#16 Pos genotype--colposcopy ECC at least high grade dysplasia;cannot exclude adenocarcinoma insitu  . Anemia    d/t heavy menstrual cycles  . Anxiety   . Cancer ([place])    cervical pre cancerous cell  . Cervical scoliosis 02/04/2018  . Congenital nystagmus 06/18/2017  . Depression   . Dysmenorrhea   . Eczema   . GERD (gastroesophageal reflux disease) 06/23/2014  . Nystagmus    her whole life, sees [place]  . Nystagmus   . Small fiber neuropathy   . Urticaria     Past Surgical History:  Procedure Laterality Date  . ABDOMINAL HYSTERECTOMY  2018   R-TLH w/Bil.Salpingectomy  . CERVICAL CONIZATION W/BX N/A 04/27/2015   Procedure: CONIZATION CERVIX WITH BIOPSY with ECC;  Surgeon: Nunzio Cobbs, MD;  Location: [facility];  Service: Gynecology;  Laterality: N/A;  . CESAREAN SECTION     2009, 2006, and 2004  . CYSTOSCOPY N/A 06/22/2015   Procedure: CYSTOSCOPY;  Surgeon: Nunzio Cobbs, MD;  Location: [facility];  Service: Gynecology;  Laterality: N/A;  . WISDOM TOOTH EXTRACTION      There were no vitals filed for this visit.  Subjective Assessment - 12/02/19 0938    Subjective  Pt reports no change in  symptoms since last visit.  She believes some of her joint pain started when she began a new medication; she's already informed the doctor who prescribed it.  "I wish I could take my spine out".    Pertinent History  polyneuropathy ~ past 7 years; bipolar    Patient Stated Goals  does not want to hurt every time she moves    Currently in Pain?  Yes    Pain Score  5     Pain Location  Thoracic    Pain Orientation  Upper    Pain Descriptors / Indicators  Sore         [facility] PT Assessment - 12/02/19 0001      Assessment   Medical Diagnosis  Thoracic pain; cervical spondylosis    Referring Provider (PT)  Dr Dianah Field     Onset Date/Surgical Date  10/01/19    Hand Dominance  Right    Next MD Visit  f/u in a few weeks no appt scheduled     Prior Therapy  none        [facility] Adult PT Treatment/Exercise - 12/02/19 0001      Shoulder Exercises: Sidelying   Other Sidelying Exercises  open book x 5 reps, without resistance, then with yellow band x 5 - each side.  Shoulder Exercises: Standing   External Rotation  Strengthening;12 reps    Theraband Level (Shoulder External Rotation)  Level 2 (Red)    Row  Both;10 reps;Strengthening    Theraband Level (Shoulder Row)  Level 3 (Green)    Row Limitations  step back bow and arrow x 5 each side - green TB     Diagonals  Strengthening;Right;Left;10 reps    Theraband Level (Shoulder Diagonals)  Level 2 (Red)   mirror for posture / form feedback     Shoulder Exercises: ROM/Strengthening   UBE (Upper Arm Bike)  L2: 1 min forward/ 1 min backward       Shoulder Exercises: Stretch   Other Shoulder Stretches  midlevel doorway stretch x 20 sec     Other Shoulder Stretches  prolonged stretch over rolled pillow perpendicular to spine, pillow at head x 2 min (sore in ribs afterwards), stretch over small massage block in upper thoracic spine (minimal tolerance)      Moist Heat Therapy   Number Minutes Moist Heat  10 Minutes    Moist Heat Location   Cervical   thoracic     Electrical Stimulation   Electrical Stimulation Location  upper thoracic paraspinals    Electrical Stimulation Action  IFC    Electrical Stimulation Parameters  intensity to tolerance    Electrical Stimulation Goals  Pain        PT Long Term Goals - 11/05/19 1235      PT LONG TERM GOAL #1   Title  Improve posture and alignment with patient to demonstrate improved upright posture with posterior shoulder girdle musculature activiated    Time  6    Period  Weeks    Status  New    Target Date  12/16/19      PT LONG TERM GOAL #2   Title  Improve cervical AROM in lateral flexio by 5-7 degrees and thoracic extension    Time  6    Period  Weeks    Status  New    Target Date  12/16/19      PT LONG TERM GOAL #3   Title  Decrease pain intensity thoracic spine by 50-75% allowing patient to increase ADL's    Time  6    Period  Weeks    Status  New    Target Date  12/16/19      PT LONG TERM GOAL #4   Title  Independent in HEP    Time  6    Period  Weeks    Status  New    Target Date  12/16/19      PT LONG TERM GOAL #5   Title  Improve FOTO to </= 29% limitation    Time  6    Period  Weeks    Status  New    Target Date  12/16/19            Plan - 12/02/19 1011    Clinical Impression Statement  Pt tolerated rolled pillow across midback in supine with pillow at head, better than last attempt with pool noodle.  Pt reporting gradual improvement of symptoms since initiating therapy. She tolerated increased resistance with postural strengthening without difficulty.  Goals are ongoing.    Rehab Potential  Good    PT Frequency  2x / week    PT Duration  6 weeks    PT Treatment/Interventions  Taping;ADLs/Self Care Home Management;Patient/family education;Cryotherapy;Electrical Stimulation;Iontophoresis 4mg /ml Dexamethasone;Moist Heat;Traction;Ultrasound;Therapeutic activities;Therapeutic exercise;Neuromuscular re-education;Manual techniques;Dry  needling     PT Next Visit Plan  continue with strengthening and stabilization; mob thoracic spine and manual work thoracic and periscapular musculature into cervical area as needed; postural correction; progress with HEP as indicated; modalities as indicated (pt does not like massage)    PT [place]; [facility]    Consulted and Agree with Plan of Care  Patient       Patient will benefit from skilled therapeutic intervention in order to improve the following deficits and impairments:     Visit Diagnosis: Pain in thoracic spine  Cervicalgia  Other symptoms and signs involving the musculoskeletal system     Problem List Patient Active Problem List   Diagnosis Date Noted  . Sensation of throat constriction 04/03/2019  . GI symptoms 04/03/2019  . Recurrent dermatitis 04/03/2019  . Small fiber neuropathy 09/23/2018  . Chronic pain disorder 09/08/2018  . Cervical spondylosis without myelopathy 09/08/2018  . Idiopathic small fiber sensory neuropathy 09/08/2018  . Abdominal pain, chronic, left upper quadrant 09/08/2018  . Cervical scoliosis 02/04/2018  . Congenital nystagmus 06/18/2017  . Chest pain 10/04/2016  . Right-sided chest wall pain 10/04/2016  . Anxiety and depression 10/04/2016  . History of bipolar disorder 10/04/2016  . Fatigue 10/04/2016  . Agoraphobia 10/04/2016  . Status post laparoscopic hysterectomy 06/22/2015  . Elevated LFTs 06/23/2014  . Fibrocystic breast 06/23/2014  . GERD (gastroesophageal reflux disease) 06/23/2014   Kerin Perna, PTA 12/02/19 10:14 AM  [place] [address] [place], [place], [address] Phone: [phone]   Fax:  [phone]  Name: Tara Velazquez MRN: [id number] Date of Birth: [birth year]

## 2019-12-02 NOTE — Telephone Encounter (Signed)
Routing to provider  

## 2019-12-04 DIAGNOSIS — D225 Melanocytic nevi of trunk: Secondary | ICD-10-CM | POA: Diagnosis not present

## 2019-12-04 DIAGNOSIS — L719 Rosacea, unspecified: Secondary | ICD-10-CM | POA: Diagnosis not present

## 2019-12-05 ENCOUNTER — Encounter: Payer: BC Managed Care – PPO | Admitting: Rehabilitative and Restorative Service Providers"

## 2019-12-08 ENCOUNTER — Encounter: Payer: Self-pay | Admitting: Rehabilitative and Restorative Service Providers"

## 2019-12-08 ENCOUNTER — Other Ambulatory Visit: Payer: Self-pay

## 2019-12-08 ENCOUNTER — Ambulatory Visit (INDEPENDENT_AMBULATORY_CARE_PROVIDER_SITE_OTHER): Payer: BC Managed Care – PPO | Admitting: Rehabilitative and Restorative Service Providers"

## 2019-12-08 DIAGNOSIS — R29898 Other symptoms and signs involving the musculoskeletal system: Secondary | ICD-10-CM

## 2019-12-08 DIAGNOSIS — M546 Pain in thoracic spine: Secondary | ICD-10-CM | POA: Diagnosis not present

## 2019-12-08 DIAGNOSIS — M542 Cervicalgia: Secondary | ICD-10-CM | POA: Diagnosis not present

## 2019-12-08 NOTE — Therapy (Signed)
[place], [place], [address] Phone: [phone]   Fax:  [phone]  Physical Therapy Treatment  Patient Details  Name: Tara Velazquez MRN: [id number] Date of Birth: 06-28-1981 Referring Provider (PT): Dr Dianah Field    Encounter Date: 12/08/2019  PT End of Session - 12/08/19 0935    Visit Number  7    Number of Visits  12    Date for PT Re-Evaluation  12/16/19    PT Start Time  0931    PT Stop Time  1015    PT Time Calculation (min)  44 min       Past Medical History:  Diagnosis Date  . Abnormal Pap smear of cervix 03-08-15   --normal pap:Pos HR HPV w/#16 Pos genotype--colposcopy ECC at least high grade dysplasia;cannot exclude adenocarcinoma insitu  . Anemia    d/t heavy menstrual cycles  . Anxiety   . Cancer ([place])    cervical pre cancerous cell  . Cervical scoliosis 02/04/2018  . Congenital nystagmus 06/18/2017  . Depression   . Dysmenorrhea   . Eczema   . GERD (gastroesophageal reflux disease) 06/23/2014  . Nystagmus    her whole life, sees [place]  . Nystagmus   . Small fiber neuropathy   . Urticaria     Past Surgical History:  Procedure Laterality Date  . ABDOMINAL HYSTERECTOMY  2018   R-TLH w/Bil.Salpingectomy  . CERVICAL CONIZATION W/BX N/A 04/27/2015   Procedure: CONIZATION CERVIX WITH BIOPSY with ECC;  Surgeon: Nunzio Cobbs, MD;  Location: [facility];  Service: Gynecology;  Laterality: N/A;  . CESAREAN SECTION     2009, 2006, and 2004  . CYSTOSCOPY N/A 06/22/2015   Procedure: CYSTOSCOPY;  Surgeon: Nunzio Cobbs, MD;  Location: [facility];  Service: Gynecology;  Laterality: N/A;  . WISDOM TOOTH EXTRACTION      There were no vitals filed for this visit.  Subjective Assessment - 12/08/19 0936    Subjective  Patient reports that she has increased pain in the upper back and neck with pain into the Lt arm again. She has been doing exercises but has been  cleaning and packing her home getting ready to move. Her family will move in the summer.    Currently in Pain?  Yes    Pain Score  7     Pain Location  Thoracic    Pain Orientation  Upper    Pain Descriptors / Indicators  Stabbing                       [facility] Adult PT Treatment/Exercise - 12/08/19 0001      Shoulder Exercises: ROM/Strengthening   UBE (Upper Arm Bike)  L2: 4 min alternating forward and backward       Shoulder Exercises: Stretch   Other Shoulder Stretches  3 way doorway stretch 30 sec x 2 reps each position       Moist Heat Therapy   Number Minutes Moist Heat  14 Minutes    Moist Heat Location  Cervical   thoracic     Electrical Stimulation   Electrical Stimulation Location  Lt cervical to upper thoracic paraspinals    Electrical Stimulation Action  IFC    Electrical Stimulation Parameters  to tolerance    Electrical Stimulation Goals  Pain;Tone      Manual Therapy   Manual therapy comments  pt supine  Joint Mobilization  1st rib and clavicular mobs     Soft tissue mobilization  soft tissue wor through the Lt > Rt cervical musculature - tightness noted through the scaleni; upper trap; SCM; pecs     Myofascial Release  anterior chest        Trigger Point Dry Needling - 12/08/19 0001    Consent Given?  Yes    Education Handout Provided  Yes    Other Dry Needling  Lt - one needle     Scalenes Response  --   no change - pt did not tolerate DN well           PT Education - 12/08/19 1013    Education Details  DN    Person(s) Educated  Patient    Methods  Explanation;Handout    Comprehension  Verbalized understanding          PT Long Term Goals - 11/05/19 1235      PT LONG TERM GOAL #1   Title  Improve posture and alignment with patient to demonstrate improved upright posture with posterior shoulder girdle musculature activiated    Time  6    Period  Weeks    Status  New    Target Date  12/16/19      PT LONG TERM GOAL #2    Title  Improve cervical AROM in lateral flexio by 5-7 degrees and thoracic extension    Time  6    Period  Weeks    Status  New    Target Date  12/16/19      PT LONG TERM GOAL #3   Title  Decrease pain intensity thoracic spine by 50-75% allowing patient to increase ADL's    Time  6    Period  Weeks    Status  New    Target Date  12/16/19      PT LONG TERM GOAL #4   Title  Independent in HEP    Time  6    Period  Weeks    Status  New    Target Date  12/16/19      PT LONG TERM GOAL #5   Title  Improve FOTO to </= 29% limitation    Time  6    Period  Weeks    Status  New    Target Date  12/16/19            Plan - 12/08/19 1013    Clinical Impression Statement  Patient presents with increased thoracic and Lt UE symptoms today. She reports that she has continued with her HEP. Does report that she has been getting ready to move and has been doing more around her house. Patient has tightness Lt ant/lat/post cervical spine; clavicular area; anterior chest. Patient did not tolerate trial of DN. Encouraged her to decrease activity and continue with stretching and strengthening exercises Discussed the nature of chronic pain.    Rehab Potential  Good    PT Frequency  2x / week    PT Duration  6 weeks    PT Treatment/Interventions  Taping;ADLs/Self Care Home Management;Patient/family education;Cryotherapy;Electrical Stimulation;Iontophoresis 4mg /ml Dexamethasone;Moist Heat;Traction;Ultrasound;Therapeutic activities;Therapeutic exercise;Neuromuscular re-education;Manual techniques;Dry needling    PT Next Visit Plan  continue with strengthening and stabilization; mob thoracic spine and manual work thoracic and periscapular musculature into cervical area as needed; postural correction; progress with HEP as indicated; modalities as indicated (pt does not like massage)    PT Home Exercise Plan  [id number]; V[facility]  Consulted and Agree with Plan of Care  Patient       Patient will benefit  from skilled therapeutic intervention in order to improve the following deficits and impairments:     Visit Diagnosis: Pain in thoracic spine  Cervicalgia  Other symptoms and signs involving the musculoskeletal system     Problem List Patient Active Problem List   Diagnosis Date Noted  . Sensation of throat constriction 04/03/2019  . GI symptoms 04/03/2019  . Recurrent dermatitis 04/03/2019  . Small fiber neuropathy 09/23/2018  . Chronic pain disorder 09/08/2018  . Cervical spondylosis without myelopathy 09/08/2018  . Idiopathic small fiber sensory neuropathy 09/08/2018  . Abdominal pain, chronic, left upper quadrant 09/08/2018  . Cervical scoliosis 02/04/2018  . Congenital nystagmus 06/18/2017  . Chest pain 10/04/2016  . Right-sided chest wall pain 10/04/2016  . Anxiety and depression 10/04/2016  . History of bipolar disorder 10/04/2016  . Fatigue 10/04/2016  . Agoraphobia 10/04/2016  . Status post laparoscopic hysterectomy 06/22/2015  . Elevated LFTs 06/23/2014  . Fibrocystic breast 06/23/2014  . GERD (gastroesophageal reflux disease) 06/23/2014    Dare Sanger Nilda Simmer PT, MPH  12/08/2019, 10:17 AM  [facility] [place], [place], [address] Phone: [phone]   Fax:  [phone]  Name: Alaythia Dickert MRN: [id number] Date of Birth: Mar 01, 1981

## 2019-12-08 NOTE — Patient Instructions (Signed)

## 2019-12-11 ENCOUNTER — Ambulatory Visit (INDEPENDENT_AMBULATORY_CARE_PROVIDER_SITE_OTHER): Payer: BC Managed Care – PPO | Admitting: Physical Therapy

## 2019-12-11 ENCOUNTER — Other Ambulatory Visit: Payer: Self-pay

## 2019-12-11 DIAGNOSIS — M542 Cervicalgia: Secondary | ICD-10-CM | POA: Diagnosis not present

## 2019-12-11 DIAGNOSIS — R29898 Other symptoms and signs involving the musculoskeletal system: Secondary | ICD-10-CM

## 2019-12-11 DIAGNOSIS — M546 Pain in thoracic spine: Secondary | ICD-10-CM

## 2019-12-11 NOTE — Therapy (Addendum)
[place], [place], [address] Phone: [phone]   Fax:  [phone]  Physical Therapy Treatment  Patient Details  Name: Tara Velazquez MRN: [id number] Date of Birth: January 24, 1981 Referring Provider (PT): Dr Dianah Field    Encounter Date: 12/11/2019  PT End of Session - 12/11/19 0936    Visit Number  8    Number of Visits  12    Date for PT Re-Evaluation  12/16/19    PT Start Time  0932    PT Stop Time  1015    PT Time Calculation (min)  43 min    Activity Tolerance  Patient tolerated treatment well    Behavior During Therapy  [facility] for tasks assessed/performed       Past Medical History:  Diagnosis Date  . Abnormal Pap smear of cervix 03-08-15   --normal pap:Pos HR HPV w/#16 Pos genotype--colposcopy ECC at least high grade dysplasia;cannot exclude adenocarcinoma insitu  . Anemia    d/t heavy menstrual cycles  . Anxiety   . Cancer ([place])    cervical pre cancerous cell  . Cervical scoliosis 02/04/2018  . Congenital nystagmus 06/18/2017  . Depression   . Dysmenorrhea   . Eczema   . GERD (gastroesophageal reflux disease) 06/23/2014  . Nystagmus    her whole life, sees [place]  . Nystagmus   . Small fiber neuropathy   . Urticaria     Past Surgical History:  Procedure Laterality Date  . ABDOMINAL HYSTERECTOMY  2018   R-TLH w/Bil.Salpingectomy  . CERVICAL CONIZATION W/BX N/A 04/27/2015   Procedure: CONIZATION CERVIX WITH BIOPSY with ECC;  Surgeon: Nunzio Cobbs, MD;  Location: [facility];  Service: Gynecology;  Laterality: N/A;  . CESAREAN SECTION     2009, 2006, and 2004  . CYSTOSCOPY N/A 06/22/2015   Procedure: CYSTOSCOPY;  Surgeon: Nunzio Cobbs, MD;  Location: [facility];  Service: Gynecology;  Laterality: N/A;  . WISDOM TOOTH EXTRACTION      There were no vitals filed for this visit.  Subjective Assessment - 12/11/19 0936    Subjective  Pt reports she is sore; but  the pain is more wide spread.  She has not been packing and cleaning the last 2 days.  She will be doing it more this weekend.   She has an MRI scheduled May 1.    Patient Stated Goals  does not want to hurt every time she moves    Currently in Pain?  Yes    Pain Score  5     Pain Location  Generalized    Pain Descriptors / Indicators  Sore         [facility] PT Assessment - 12/11/19 0001      Assessment   Medical Diagnosis  Thoracic pain; cervical spondylosis    Referring Provider (PT)  Dr Dianah Field     Onset Date/Surgical Date  10/01/19    Hand Dominance  Right    Next MD Visit  to be scheduled after MRI    Prior Therapy  none       Observation/Other Assessments   Focus on Therapeutic Outcomes (FOTO)   37% limitation.       AROM   Cervical - Right Side Bend  40    Cervical - Left Side Bend  40       [facility] Adult PT Treatment/Exercise - 12/11/19 0001      Shoulder Exercises: Supine  Horizontal ABduction  Both;10 reps;Theraband    Theraband Level (Shoulder Horizontal ABduction)  Level 1 (Yellow)    External Rotation  Strengthening;Both;10 reps;Theraband    Theraband Level (Shoulder External Rotation)  Level 1 (Yellow)    Flexion  Both;10 reps;Strengthening   overhead pull   Theraband Level (Shoulder Flexion)  Level 1 (Yellow)    Diagonals  Right;Left;10 reps;Strengthening;Theraband    Theraband Level (Shoulder Diagonals)  Level 1 (Yellow)    Other Supine Exercises  Lower trunk rotation with arms in T x 10 sec x 4 reps each direction;   single knee to chest x 20 sec x 2 reps each leg.       Shoulder Exercises: Sidelying   Other Sidelying Exercises  open book x 5 reps, without resistance, then with yellow band x 5 - each side.       Shoulder Exercises: ROM/Strengthening   UBE (Upper Arm Bike)  L2: 1.5 min forward, 1.5 min backward      Shoulder Exercises: Stretch   Other Shoulder Stretches  3 way doorway stretch 30 sec x 2 reps each position     Other Shoulder Stretches   bilat wrist ext stretch (palms together) x 20 sec, wrist flexion stretch (back of palms together) x 20 sec       Modalities   Modalities  --   declined; didn't notice difference with estim.      Moist Heat Therapy   Number Minutes Moist Heat  --   declined; will use heat at home.                  PT Long Term Goals - 12/11/19 0950      PT LONG TERM GOAL #1   Title  Improve posture and alignment with patient to demonstrate improved upright posture with posterior shoulder girdle musculature activiated    Time  6    Period  Weeks    Status  On-going      PT LONG TERM GOAL #2   Title  Improve cervical AROM in lateral flexion by 5-7 degrees and thoracic extension    Time  6    Period  Weeks    Status  Achieved      PT LONG TERM GOAL #3   Title  Decrease pain intensity thoracic spine by 50-75% allowing patient to increase ADL's    Baseline  Decreased initially by 70%, now only 30% decreased - reported 12/11/19    Time  6    Period  Weeks    Status  Partially Met      PT LONG TERM GOAL #4   Title  Independent in HEP    Time  6    Period  Weeks    Status  On-going      PT LONG TERM GOAL #5   Title  Improve FOTO to </= 29% limitation    Time  6    Period  Weeks    Status  On-going            Plan - 12/11/19 0957    Clinical Impression Statement  Pt reporting initiatlly 70% improvement in pain in thoracic area; has changed to only 30% improvement since initiating therapy.  FOTO score remains unchanged.  She tolerated today's gentle strengthening/stretches in supine with minimal increase in discomfort.  Pt has met LTG#2 and partially met LTG#3; progressing gradually towards remaining goals.    Rehab Potential  Good    PT Frequency  2x / week    PT Duration  6 weeks    PT Treatment/Interventions  Taping;ADLs/Self Care Home Management;Patient/family education;Cryotherapy;Electrical Stimulation;Iontophoresis 36m/ml Dexamethasone;Moist  Heat;Traction;Ultrasound;Therapeutic activities;Therapeutic exercise;Neuromuscular re-education;Manual techniques;Dry needling    PT Next Visit Plan  pt on hold for 2 wks; will be out of town and then will have insurance change.  Pt to call to schedule.    PT [facility]    Consulted and Agree with Plan of Care  Patient       Patient will benefit from skilled therapeutic intervention in order to improve the following deficits and impairments:     Visit Diagnosis: Pain in thoracic spine  Cervicalgia  Other symptoms and signs involving the musculoskeletal system     Problem List Patient Active Problem List   Diagnosis Date Noted  . Sensation of throat constriction 04/03/2019  . GI symptoms 04/03/2019  . Recurrent dermatitis 04/03/2019  . Small fiber neuropathy 09/23/2018  . Chronic pain disorder 09/08/2018  . Cervical spondylosis without myelopathy 09/08/2018  . Idiopathic small fiber sensory neuropathy 09/08/2018  . Abdominal pain, chronic, left upper quadrant 09/08/2018  . Cervical scoliosis 02/04/2018  . Congenital nystagmus 06/18/2017  . Chest pain 10/04/2016  . Right-sided chest wall pain 10/04/2016  . Anxiety and depression 10/04/2016  . History of bipolar disorder 10/04/2016  . Fatigue 10/04/2016  . Agoraphobia 10/04/2016  . Status post laparoscopic hysterectomy 06/22/2015  . Elevated LFTs 06/23/2014  . Fibrocystic breast 06/23/2014  . GERD (gastroesophageal reflux disease) 06/23/2014   JKerin Perna PTA 12/11/19 10:36 AM  [place] [address]: [phone]  Fax:  [phone] Name: JSaraiyah HemmingerMRN: [id number] of Birth: 9Feb 01, 1982 PHYSICAL THERAPY DISCHARGE SUMMARY  Visits from Start of Care: 8  Current functional level related to goals / functional outcomes: See progress note for discharge status    Remaining  deficits: Unknown    Education / Equipment: HEP  Plan: Patient agrees to discharge.  Patient goals were partially met. Patient is being discharged due to being pleased with the current functional level.  ?????    Celyn P. HHelene KelpPT, MPH 02/19/20 6:03 PM

## 2019-12-14 ENCOUNTER — Ambulatory Visit (INDEPENDENT_AMBULATORY_CARE_PROVIDER_SITE_OTHER): Payer: BC Managed Care – PPO

## 2019-12-14 ENCOUNTER — Other Ambulatory Visit: Payer: Self-pay

## 2019-12-14 DIAGNOSIS — M47812 Spondylosis without myelopathy or radiculopathy, cervical region: Secondary | ICD-10-CM

## 2019-12-14 DIAGNOSIS — M542 Cervicalgia: Secondary | ICD-10-CM | POA: Diagnosis not present

## 2019-12-20 ENCOUNTER — Other Ambulatory Visit: Payer: BC Managed Care – PPO

## 2019-12-25 DIAGNOSIS — M47812 Spondylosis without myelopathy or radiculopathy, cervical region: Secondary | ICD-10-CM

## 2019-12-26 NOTE — Assessment & Plan Note (Signed)
See prior assessment and plan for specific details of what is been tried, and her diagnostics so far. Her cervical spine MRI does show mild disc disease, and mild facet disease, pain is mostly on the left so we are going to proceed with a left C5-C6, C6-C7, and C7-T1 facet joint injections, return to see me afterwards, we will proceed with a cervical epidural if no better.

## 2019-12-26 NOTE — Telephone Encounter (Signed)
Facet joint injections ordered at [facility], please contact them for scheduling.

## 2019-12-29 ENCOUNTER — Other Ambulatory Visit: Payer: Self-pay | Admitting: Sports Medicine

## 2019-12-29 DIAGNOSIS — M47812 Spondylosis without myelopathy or radiculopathy, cervical region: Secondary | ICD-10-CM

## 2020-01-07 ENCOUNTER — Other Ambulatory Visit: Payer: BC Managed Care – PPO

## 2020-03-06 ENCOUNTER — Other Ambulatory Visit: Payer: Self-pay | Admitting: Sports Medicine

## 2020-03-06 DIAGNOSIS — M47812 Spondylosis without myelopathy or radiculopathy, cervical region: Secondary | ICD-10-CM

## 2020-03-24 ENCOUNTER — Ambulatory Visit: Payer: Self-pay | Admitting: Obstetrics and Gynecology
